# Patient Record
Sex: Male | Born: 2000 | Race: White | Hispanic: No | Marital: Single | Smoking: Never smoker
Health system: Southern US, Community
[De-identification: ages and names within clinical notes are randomized; demographics above are authoritative.]

## PROBLEM LIST (undated history)

## (undated) DIAGNOSIS — T7840XA Allergy, unspecified, initial encounter: Secondary | ICD-10-CM

## (undated) HISTORY — DX: Allergy, unspecified, initial encounter: T78.40XA

## (undated) HISTORY — PX: OTHER SURGICAL HISTORY: SHX169

---
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
## 2020-11-23 IMAGING — US US THYROID
1 series · 14 of 25 positions shown · non-contrast
Comparison: None.

CLINICAL DATA: Nodule

EXAM:
THYROID ULTRASOUND
TECHNIQUE: Ultrasound examination of the thyroid gland and adjacent soft
tissues was performed.

[Series 1: us thyroid · 0.07mm/px · 14 of 38 slices shown]
[im 1/38]
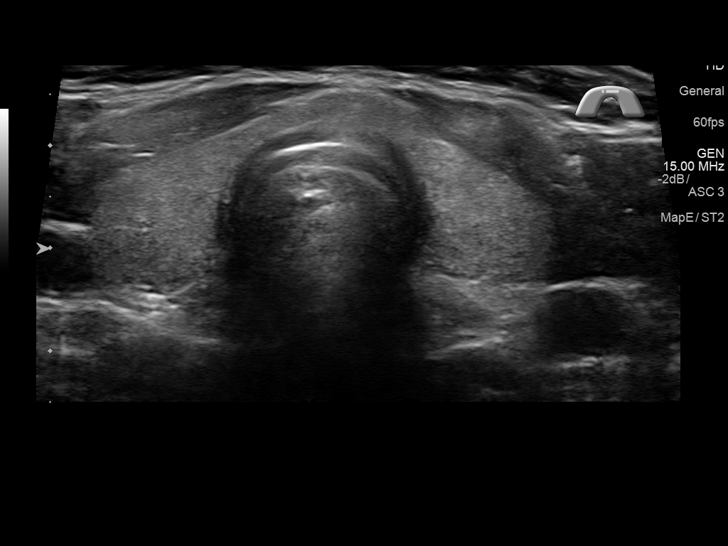
[im 4/38]
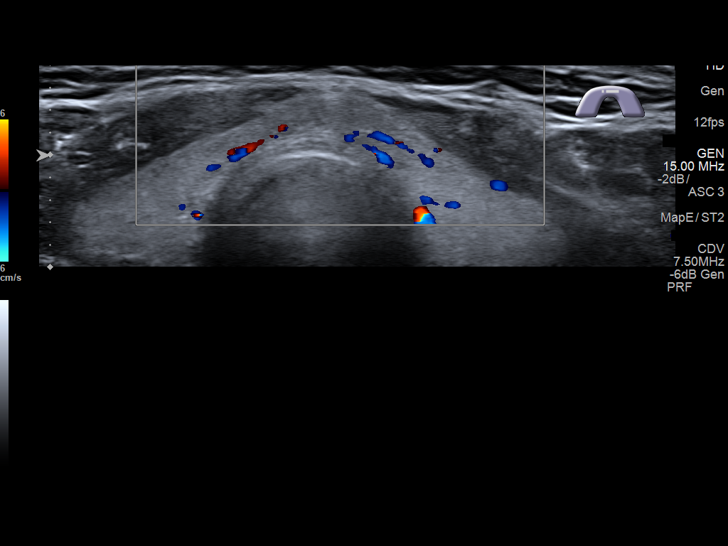
[im 7/38]
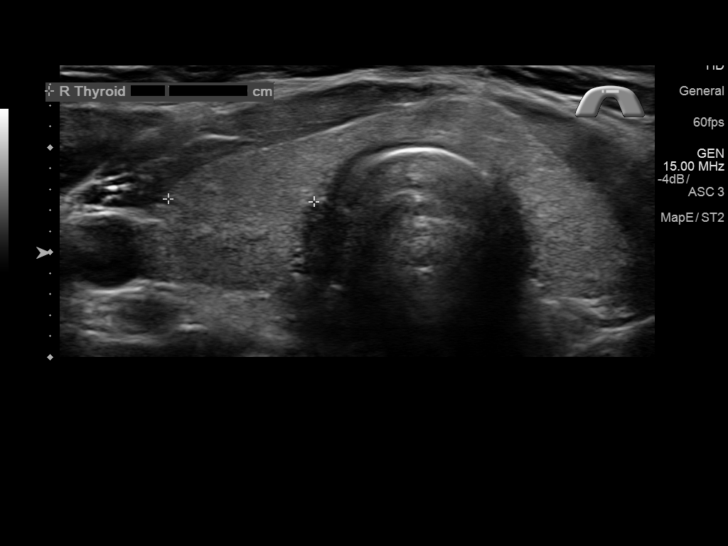
[im 10/38]
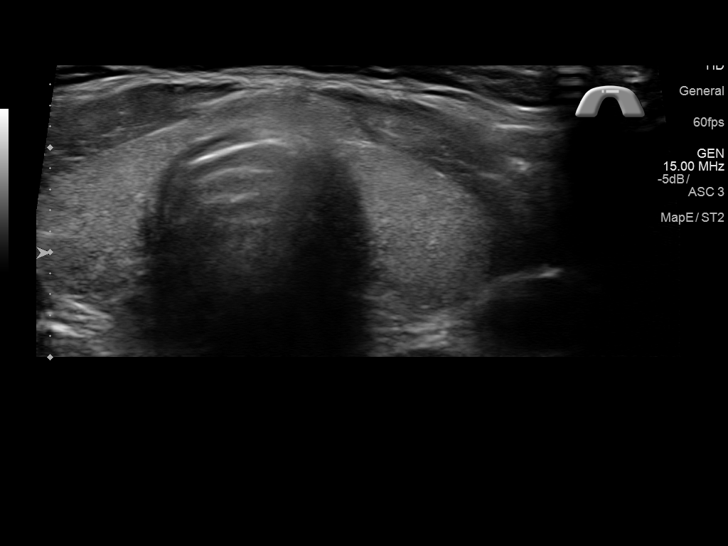
[im 13/38]
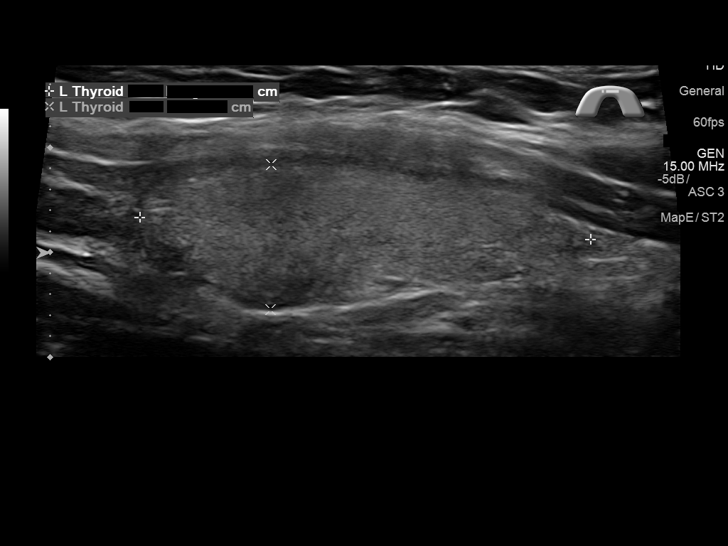
[im 14/38]
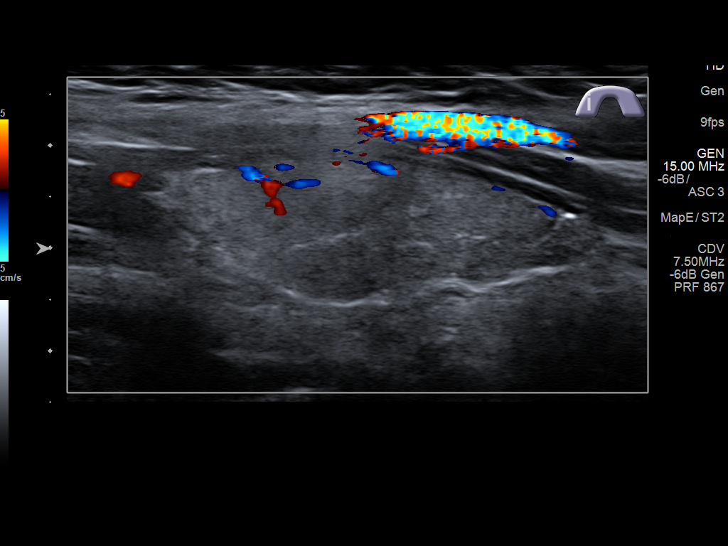
[im 17/38]
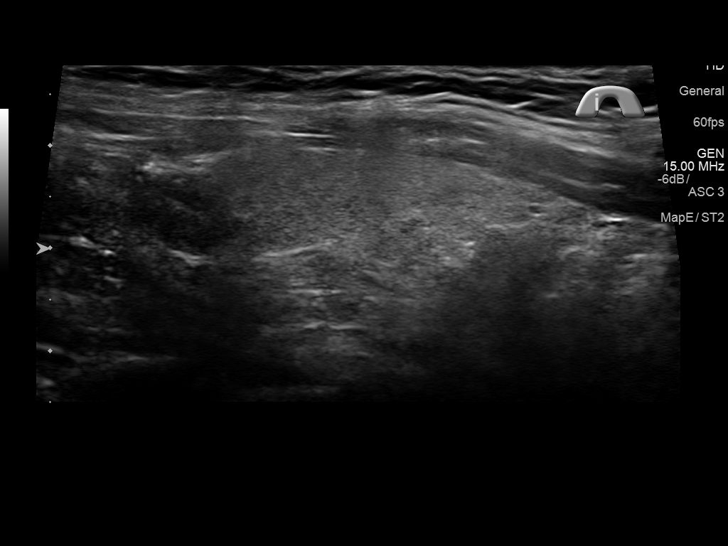
[im 21/38]
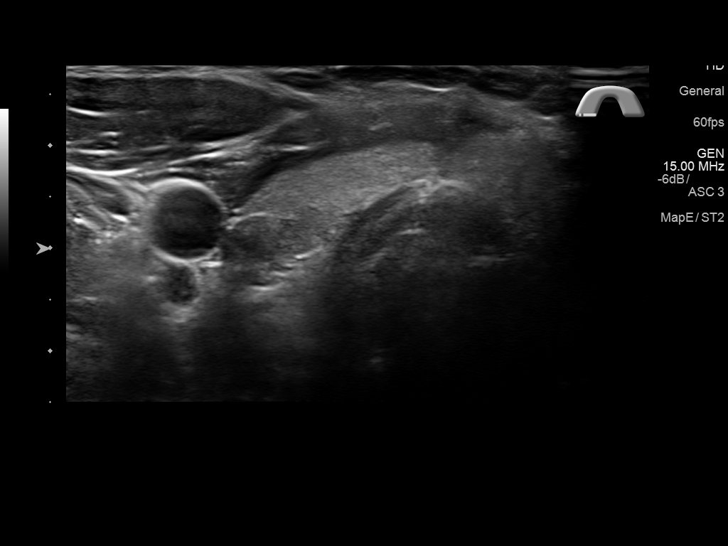
[im 24/38]
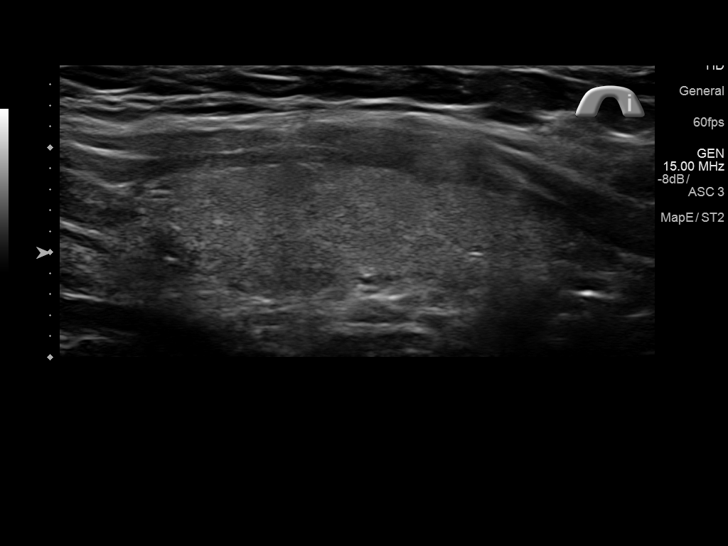
[im 25/38]
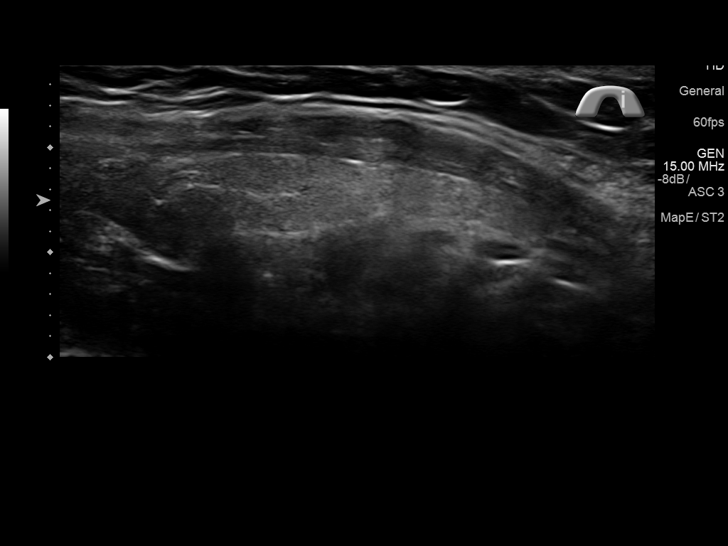
[im 28/38]
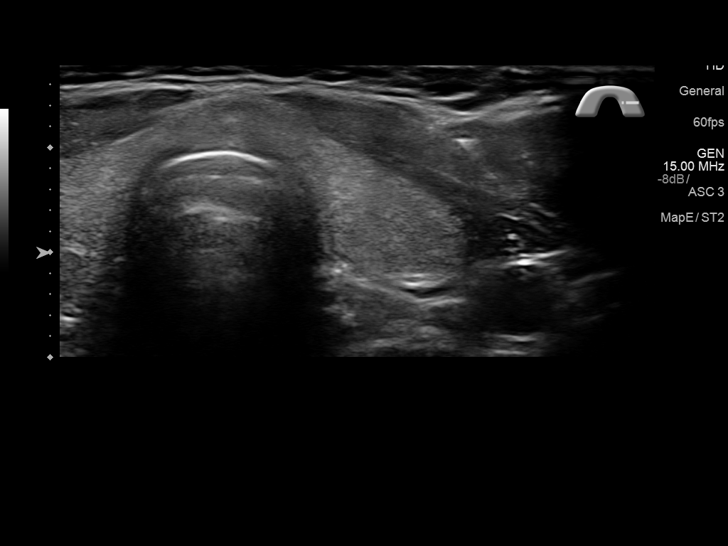
[im 31/38]
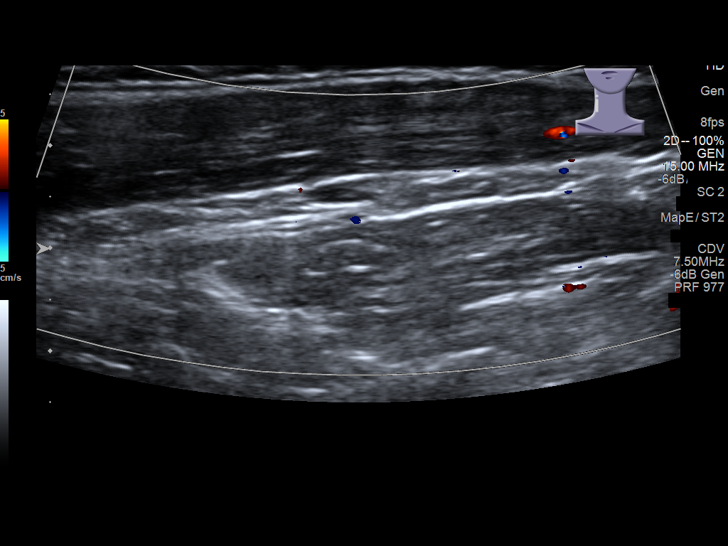
[im 34/38]
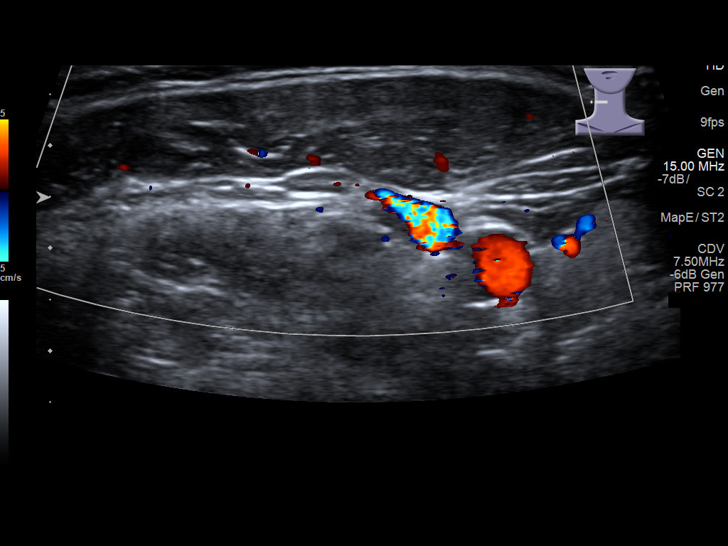
[im 38/38]
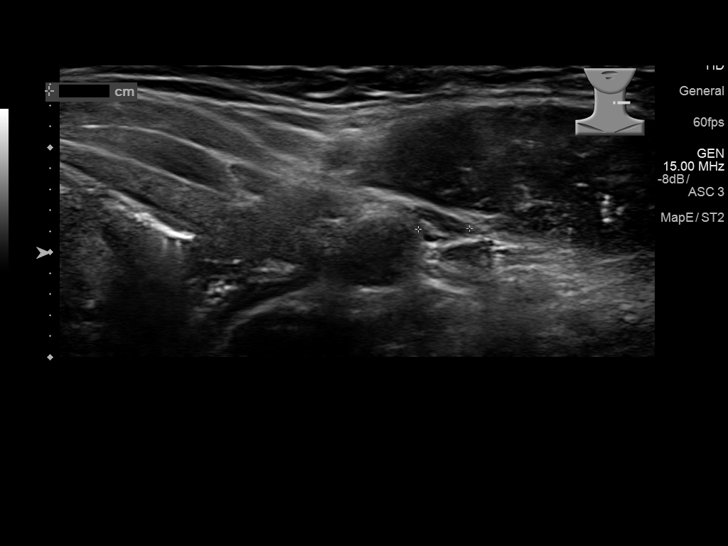

[14 of 25 positions shown; findings below may reference images not displayed]

FINDINGS: Parenchymal Echotexture: Normal

Isthmus: 0.4 cm thickness

Right lobe: 4.6 x 1.5 x 1.4 cm

Left lobe: 4.3 x 1.4 x 1.3 cm

_________________________________________________________

Estimated total number of nodules >/= 1 cm: 0

Number of spongiform nodules >/=  2 cm not described below (TR1): 0

Number of mixed cystic and solid nodules >/= 1.5 cm not described
below (TR2): 0

_________________________________________________________

No discrete nodules are seen within the thyroid gland.
IMPRESSION: Normal

The above is in keeping with the ACR TI-RADS recommendations - [HOSPITAL] 3869;[DATE].

## 2021-10-30 DIAGNOSIS — S62665A Nondisplaced fracture of distal phalanx of left ring finger, initial encounter for closed fracture: Secondary | ICD-10-CM | POA: Insufficient documentation

## 2021-10-30 DIAGNOSIS — M79642 Pain in left hand: Secondary | ICD-10-CM | POA: Insufficient documentation

## 2022-03-30 ENCOUNTER — Ambulatory Visit: Payer: Managed Care, Other (non HMO) | Attending: Internal Medicine

## 2022-03-30 ENCOUNTER — Other Ambulatory Visit: Payer: Self-pay

## 2022-03-30 DIAGNOSIS — Z23 Encounter for immunization: Secondary | ICD-10-CM

## 2022-03-30 MED ORDER — NOVAVAX COVID-19 VACCINE 5 MCG/0.5ML IM SUSP
INTRAMUSCULAR | 0 refills | Status: DC
  Filled 2022-03-30: qty 0.5, 1d supply, fill #0

## 2022-03-30 NOTE — Progress Notes (Signed)
? ?  Covid-19 Vaccination Clinic ? ?Name:  Darrell Greer    ?MRN: [id number] ?DOB: Feb 23, 2001 ? ?03/30/2022 ? ?[name] was observed post Covid-19 immunization for 15 minutes without incident. He was provided with Vaccine Information Sheet and instruction to access the V-Safe system.  ? ?[name] was instructed to call 911 with any severe reactions post vaccine: ?Difficulty breathing  ?Swelling of face and throat  ?A fast heartbeat  ?A bad rash all over body  ?Dizziness and weakness  ? ?Immunizations Administered   ? ? Name Date Dose VIS Date Route  ? Novavax(covid-19) Vaccine 03/30/2022 12:36 PM 0.5 mL 07/19/2021 Intramuscular  ? Manufacturer: [employer]  ? Lot: [id number]  ? NDC: 91638-466-59  ? ?  ? ?

## 2022-04-02 ENCOUNTER — Other Ambulatory Visit: Payer: Self-pay

## 2022-04-02 ENCOUNTER — Emergency Department: Payer: Managed Care, Other (non HMO)

## 2022-04-02 ENCOUNTER — Encounter: Payer: Self-pay | Admitting: Emergency Medicine

## 2022-04-02 ENCOUNTER — Inpatient Hospital Stay
Admission: EM | Admit: 2022-04-02 | Discharge: 2022-04-05 | DRG: 343 | Disposition: A | Discharge status: Home / Self Care | Payer: Managed Care, Other (non HMO) | Attending: Surgery | Admitting: Surgery

## 2022-04-02 DIAGNOSIS — K353 Acute appendicitis with localized peritonitis, without perforation or gangrene: Secondary | ICD-10-CM

## 2022-04-02 DIAGNOSIS — Z20822 Contact with and (suspected) exposure to covid-19: Secondary | ICD-10-CM | POA: Diagnosis present

## 2022-04-02 DIAGNOSIS — K36 Other appendicitis: Secondary | ICD-10-CM | POA: Diagnosis present

## 2022-04-02 DIAGNOSIS — Z8052 Family history of malignant neoplasm of bladder: Secondary | ICD-10-CM

## 2022-04-02 DIAGNOSIS — Z8249 Family history of ischemic heart disease and other diseases of the circulatory system: Secondary | ICD-10-CM

## 2022-04-02 DIAGNOSIS — Z818 Family history of other mental and behavioral disorders: Secondary | ICD-10-CM

## 2022-04-02 DIAGNOSIS — K3580 Unspecified acute appendicitis: Secondary | ICD-10-CM | POA: Diagnosis present

## 2022-04-02 DIAGNOSIS — Z888 Allergy status to other drugs, medicaments and biological substances status: Secondary | ICD-10-CM

## 2022-04-02 DIAGNOSIS — K352 Acute appendicitis with generalized peritonitis, without abscess: Secondary | ICD-10-CM | POA: Diagnosis not present

## 2022-04-02 DIAGNOSIS — Z811 Family history of alcohol abuse and dependence: Secondary | ICD-10-CM

## 2022-04-02 DIAGNOSIS — Z5331 Laparoscopic surgical procedure converted to open procedure: Secondary | ICD-10-CM

## 2022-04-02 DIAGNOSIS — Z808 Family history of malignant neoplasm of other organs or systems: Secondary | ICD-10-CM

## 2022-04-02 DIAGNOSIS — Z803 Family history of malignant neoplasm of breast: Secondary | ICD-10-CM

## 2022-04-02 LAB — RESP PANEL BY RT-PCR (FLU A&B, COVID) ARPGX2
Influenza A by PCR: NEGATIVE
Influenza B by PCR: NEGATIVE
SARS Coronavirus 2 by RT PCR: NEGATIVE

## 2022-04-02 LAB — CBC
HCT: 43.8 % (ref 39.0–52.0)
Hemoglobin: 14.9 g/dL (ref 13.0–17.0)
MCH: 27.8 pg (ref 26.0–34.0)
MCHC: 34 g/dL (ref 30.0–36.0)
MCV: 81.7 fL (ref 80.0–100.0)
Platelets: 349 10*3/uL (ref 150–400)
RBC: 5.36 MIL/uL (ref 4.22–5.81)
RDW: 12.2 % (ref 11.5–15.5)
WBC: 13 10*3/uL — ABNORMAL HIGH (ref 4.0–10.5)
nRBC: 0 % (ref 0.0–0.2)

## 2022-04-02 LAB — URINALYSIS, ROUTINE W REFLEX MICROSCOPIC
Bilirubin Urine: NEGATIVE
Glucose, UA: NEGATIVE mg/dL
Hgb urine dipstick: NEGATIVE
Ketones, ur: NEGATIVE mg/dL
Leukocytes,Ua: NEGATIVE
Nitrite: NEGATIVE
Protein, ur: NEGATIVE mg/dL
Specific Gravity, Urine: 1.021 (ref 1.005–1.030)
pH: 5 (ref 5.0–8.0)

## 2022-04-02 LAB — COMPREHENSIVE METABOLIC PANEL
ALT: 32 U/L (ref 0–44)
AST: 21 U/L (ref 15–41)
Albumin: 3.9 g/dL (ref 3.5–5.0)
Alkaline Phosphatase: 75 U/L (ref 38–126)
Anion gap: 8 (ref 5–15)
BUN: 12 mg/dL (ref 6–20)
CO2: 25 mmol/L (ref 22–32)
Calcium: 9.4 mg/dL (ref 8.9–10.3)
Chloride: 103 mmol/L (ref 98–111)
Creatinine, Ser: 0.87 mg/dL (ref 0.61–1.24)
GFR, Estimated: >60 mL/min (ref >60)
Glucose, Bld: 98 mg/dL (ref 70–99)
Potassium: 4 mmol/L (ref 3.5–5.1)
Sodium: 136 mmol/L (ref 135–145)
Total Bilirubin: 1 mg/dL (ref 0.3–1.2)
Total Protein: 7.6 g/dL (ref 6.5–8.1)

## 2022-04-02 LAB — LIPASE, BLOOD: Lipase: 24 U/L (ref 11–51)

## 2022-04-02 MED ORDER — HYDROMORPHONE HCL 1 MG/ML IJ SOLN
1.0000 mg | Freq: Once | INTRAMUSCULAR | Status: AC
  Administered 2022-04-02: 1 mg via INTRAVENOUS
  Filled 2022-04-02: qty 1

## 2022-04-02 MED ORDER — LACTATED RINGERS IV BOLUS
1000.0000 mL | Freq: Once | INTRAVENOUS | Status: AC
  Administered 2022-04-02: 1000 mL via INTRAVENOUS

## 2022-04-02 MED ORDER — ONDANSETRON HCL 4 MG/2ML IJ SOLN
4.0000 mg | Freq: Once | INTRAMUSCULAR | Status: AC
  Administered 2022-04-02: 4 mg via INTRAVENOUS
  Filled 2022-04-02: qty 2

## 2022-04-02 MED ORDER — HYDROMORPHONE HCL 1 MG/ML IJ SOLN
0.5000 mg | INTRAMUSCULAR | Status: DC | PRN
  Administered 2022-04-02 – 2022-04-03 (×4): 1 mg via INTRAVENOUS
  Administered 2022-04-03: 0.5 mg via INTRAVENOUS
  Administered 2022-04-03 – 2022-04-04 (×7): 1 mg via INTRAVENOUS
  Filled 2022-04-02 (×15): qty 1

## 2022-04-02 MED ORDER — ONDANSETRON 4 MG PO TBDP
4.0000 mg | ORAL_TABLET | Freq: Four times a day (QID) | ORAL | Status: DC | PRN
  Administered 2022-04-05: 4 mg via ORAL
  Filled 2022-04-02: qty 1

## 2022-04-02 MED ORDER — HYDROMORPHONE HCL 1 MG/ML IJ SOLN
1.0000 mg | Freq: Once | INTRAMUSCULAR | Status: AC
Start: 2022-04-02 — End: 2022-04-02
  Administered 2022-04-02: 1 mg via INTRAVENOUS
  Filled 2022-04-02: qty 1

## 2022-04-02 MED ORDER — ACETAMINOPHEN 500 MG PO TABS
1000.0000 mg | ORAL_TABLET | Freq: Four times a day (QID) | ORAL | Status: DC
  Administered 2022-04-03 – 2022-04-05 (×8): 1000 mg via ORAL
  Filled 2022-04-02 (×9): qty 2

## 2022-04-02 MED ORDER — IOHEXOL 300 MG/ML  SOLN
100.0000 mL | Freq: Once | INTRAMUSCULAR | Status: AC | PRN
  Administered 2022-04-02: 100 mL via INTRAVENOUS

## 2022-04-02 MED ORDER — SODIUM CHLORIDE 0.9 % IV SOLN: INTRAVENOUS | Status: DC

## 2022-04-02 MED ORDER — PIPERACILLIN-TAZOBACTAM 3.375 G IVPB
3.3750 g | Freq: Three times a day (TID) | INTRAVENOUS | Status: DC
  Administered 2022-04-02 – 2022-04-05 (×8): 3.375 g via INTRAVENOUS
  Filled 2022-04-02 (×8): qty 50

## 2022-04-02 MED ORDER — OXYCODONE HCL 5 MG PO TABS
5.0000 mg | ORAL_TABLET | ORAL | Status: DC | PRN
  Administered 2022-04-04: 10 mg via ORAL
  Administered 2022-04-05: 5 mg via ORAL
  Administered 2022-04-05: 10 mg via ORAL
  Filled 2022-04-02 (×3): qty 2

## 2022-04-02 MED ORDER — ONDANSETRON HCL 4 MG/2ML IJ SOLN
4.0000 mg | Freq: Four times a day (QID) | INTRAMUSCULAR | Status: DC | PRN
  Administered 2022-04-03 – 2022-04-04 (×2): 4 mg via INTRAVENOUS
  Filled 2022-04-02 (×2): qty 2

## 2022-04-02 NOTE — Progress Notes (Signed)
Pharmacy Antibiotic Note ? ?Darrell Greer is a 21 y.o. male admitted on 04/02/2022 with acute appendicitis.  Pharmacy has been consulted for Zosyn dosing. ? ?Plan: ?Zosyn 3.375g IV q8h (4 hour infusion). ? ?Height: 5\' 10"  (177.8 cm) ?Weight: 109.8 kg (242 lb) ?IBW/kg (Calculated) : 73 ? ?Temp (24hrs), Avg:99.1 ?F (37.3 ?C), Min:99.1 ?F (37.3 ?C), Max:99.1 ?F (37.3 ?C) ? ?Recent Labs  ?Lab 04/02/22 ?1513  ?WBC 13.0*  ?CREATININE 0.87  ?  ?Estimated Creatinine Clearance: 168 mL/min (by C-G formula based on SCr of 0.87 mg/dL).   ? ?Allergies  ?Allergen Reactions  ? Cefdinir Diarrhea, Nausea And Vomiting and Other (See Comments)  ?  Other reaction(s): GI Intolerance, GI Upset (intolerance)  ? ? ?Antimicrobials this admission: ?4/24 Zosyn >>  ? ?Dose adjustments this admission: ? ? ?Microbiology results: ? BCx:  ? UCx:   ? Sputum:   ? MRSA PCR:  ? ?Thank you for allowing pharmacy to be a part of this patientDarrells care. ? ?5/24 Jere Bostrom ?04/02/2022 5:49 PM ? ?

## 2022-04-02 NOTE — ED Triage Notes (Signed)
Pt via POV from home. Pt c/o RLQ pain that started 2 weeks ago but got worse 2 days ago. Denies any abd surgeries. Pt also endorses some nausea. Pt is A&OX4 and NAD.  ?

## 2022-04-02 NOTE — H&P (Signed)
[facility] ?SURGICAL HISTORY & PHYSICAL (cpt [phone]) ? ?HISTORY OF PRESENT ILLNESS (HPI):  ?21 y.o. male presented to [facility] ED today for abdominal pain. Patient reports RLQ abdominal pain for the last 2 weeks. He thought initially he pulled a muscle but the pain has persisted over this time. No fever, chills, CP, nausea, emesis, bowel change, or urinary change. No history of similar. No previous intra-abdominal surgeries. Work up n the ED revealed a leukocytosis to 13.0K but laboratory work up was otherwise reassuring. CT Abdomen/Pelvis was concerning for acute uncomplicated appendicitis.  ? ?General surgery is consulted by emergency medicine physician Dr Shaune Pollackameron Isaacs, MD for evaluation and management pf acute appendicitis.  ? ? ?PAST MEDICAL HISTORY (PMH):  ?Past Medical History:  ?Diagnosis Date  ? Allergy   ?  ?Reviewed. Otherwise negative.  ? ?PAST SURGICAL HISTORY (PSH):  ?Past Surgical History:  ?Procedure Laterality Date  ? NOSE SURGERY  2015  ?  ?Reviewed. Otherwise negative.  ? ?MEDICATIONS:  ?Prior to Admission medications   ?Medication Sig Start Date End Date Taking? Authorizing Provider  ?COVID-19 adjuvanted vaccine, NOVAVAX, (NOVAVAX COVID-19 VACCINE) 5 MCG/0.5ML injection Inject into the muscle. 03/30/22   Judyann MunsonSnider, Cynthia, MD  ?ibuprofen (ADVIL,MOTRIN) 200 MG tablet Take 200 mg by mouth as needed.    [provider]  ?loratadine (CLARITIN) 10 MG tablet Take 10 mg by mouth daily.    [provider]  ?pseudoephedrine (SUDAFED) 120 MG 12 hr tablet Take 120 mg by mouth 2 (two) times daily as needed for congestion.    [provider]  ?  ? ?ALLERGIES:  ?Allergies  ?Allergen Reactions  ? Cefdinir Diarrhea, Nausea And Vomiting and Other (See Comments)  ?  Other reaction(s): GI Intolerance, GI Upset (intolerance)  ?  ? ?SOCIAL HISTORY:  ?Social History  ? ?Socioeconomic History  ? Marital status: Single  ?  Spouse name: Not on file  ? Number of children: Not on file   ? Years of education: Not on file  ? Highest education level: 11th grade  ?Occupational History  ? Occupation: HS student  ?Tobacco Use  ? Smoking status: Never  ? Smokeless tobacco: Never  ?Vaping Use  ? Vaping Use: Never used  ?Substance and Sexual Activity  ? Alcohol use: Never  ? Drug use: Never  ? Sexual activity: Not Currently  ?  Birth control/protection: None  ?Other Topics Concern  ? Not on file  ?Social History Narrative  ? Patient will be January 2019 HS graduate.  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  ? ?Social Determinants of Health  ? ?Financial Resource Strain: Not on file  ?Food Insecurity: Not on file  ?Transportation Needs: Not on file  ?Physical Activity: Not on file  ?Stress: Not on file  ?Social Connections: Not on file  ?Intimate Partner Violence: Not on file  ?  ? ?FAMILY HISTORY:  ?Family History  ?Problem Relation Age of Onset  ? Alcohol abuse Brother   ? Bipolar disorder Brother   ? Throat cancer Maternal Grandfather   ? Breast cancer Paternal Grandmother   ? Healthy Sister   ? Bladder Cancer Paternal Grandfather   ? Heart disease Paternal Grandfather   ?     Pacemaker  ? Depression Brother   ?  ?Otherwise negative.  ? ?REVIEW OF SYSTEMS:  ?Review of Systems  ?Constitutional:  Negative for chills and fever.  ?HENT:  Negative for congestion and sore  throat.   ?Respiratory:  Negative for cough and shortness of breath.   ?Cardiovascular:  Negative for chest pain and palpitations.  ?Gastrointestinal:  Positive for abdominal pain. Negative for diarrhea, nausea and vomiting.  ?Genitourinary:  Negative for dysuria and urgency.  ?All other systems reviewed and are negative. ? ?VITAL SIGNS:  ?Temp:  [99.1 ?F (37.3 ?C)] 99.1 ?F (37.3 ?C) (04/24 1509) ?Pulse Rate:  [96-105] 96 (04/24 1923) ?Resp:  [15-16] 15 (04/24 1923) ?BP: (117-142)/(75-83) 117/76 (04/24 1923) ?SpO2:  [99 %-100 %] 100 % (04/24 1923) ?Weight:  [109.8 kg] 109.8  kg (04/24 1511)     Height: 5\' 10"  (177.8 cm) Weight: 109.8 kg BMI (Calculated): 34.72  ? ?PHYSICAL EXAM:  ?Physical Exam ?Vitals and nursing note reviewed. Exam conducted with a chaperone present.  ?Constitutional:   ?   General: He is not in acute distress. ?   Appearance: He is well-developed. He is obese. He is not ill-appearing.  ?   Comments: Patient resting comfortably, NAD, family at bedside   ?Eyes:  ?   General: No scleral icterus. ?   Extraocular Movements: Extraocular movements intact.  ?Cardiovascular:  ?   Rate and Rhythm: Normal rate and regular rhythm.  ?   Heart sounds: Normal heart sounds. No murmur heard. ?Pulmonary:  ?   Effort: Pulmonary effort is normal. No respiratory distress.  ?   Breath sounds: Normal breath sounds.  ?Abdominal:  ?   General: There is no distension.  ?   Palpations: Abdomen is soft.  ?   Tenderness: There is abdominal tenderness in the right lower quadrant. There is no guarding or rebound.  ?Genitourinary: ?   Comments: Deferred ?Skin: ?   General: Skin is warm and dry.  ?   Coloration: Skin is not jaundiced or pale.  ?Neurological:  ?   General: No focal deficit present.  ?   Mental Status: He is alert and oriented to person, place, and time.  ? ? ?INTAKE/OUTPUT:  ?This shift: Total I/O ?In: 1000 [IV Piggyback:1000] ?Out: -   ?Last 2 shifts: @IOLAST2SHIFTS @ ? ?Labs:  ? ?  Latest Ref Rng & Units 04/02/2022  ?  3:13 PM  ?CBC  ?WBC 4.0 - 10.5 K/uL 13.0    ?Hemoglobin 13.0 - 17.0 g/dL    ?Hematocrit 39.0 - 52.0 % 43.8    ?Platelets 150 - 400 K/uL 349    ? ? ?  Latest Ref Rng & Units 04/02/2022  ?  3:13 PM  ?CMP  ?Glucose 70 - 99 mg/dL 98    ?BUN 6 - 20 mg/dL 12    ?Creatinine 0.61 - 1.24 mg/dL 41.7    ?Sodium 135 - 145 mmol/L 136    ?Potassium 3.5 - 5.1 mmol/L 4.0    ?Chloride 98 - 111 mmol/L 103    ?CO2 22 - 32 mmol/L 25    ?Calcium 8.9 - 10.3 mg/dL 9.4    ?Total Protein 6.5 - 8.1 g/dL 7.6    ?Total Bilirubin 0.3 - 1.2 mg/dL 1.0    ?Alkaline Phos 38 - 126 U/L 75    ?AST 15  - 41 U/L 21    ?ALT 0 - 44 U/L 32    ? ? ?Imaging studies:  ? ?CT Abdomen/Pelvis (04/02/2022) personally reviewed which shows peri-appendiceal inflammation,no abscess, no free air, and radiologist report reviewed below:  ?IMPRESSION: ?Acute appendicitis without evidence of perforation or abscess ?formation. Small appendicolith at the base of the appendix.  ? ? ?Assessment/Plan: (  ICD-10's: K35.30) ?21 y.o. male with leukocytosis and RLQ abdominal pain concerning for acute appendicitis.  ? ? - Admit to general surgery  ?- Will plan on laparoscopic appendectomy with Dr Claudine Mouton pending OR/Anesthesia availability   ? - All risks, benefits, and alternatives to above procedure(s) were discussed with the patient, all of his questions were answered to his expressed satisfaction, patient expresses he wishes to proceed, and informed consent was obtained.   ? - NPO + IVF Resuscitation  ?- IV Abx (Zosyn) ?- Monitor abdominal examination; on-going bowel function ?- Pain control prn; antiemetics prn  ? - DVT prophylaxis; Hold for OR ? ?All of the above findings and recommendations were discussed with the patient, and all of his and his family's questions were answered to their expressed satisfaction. ? ?-- ?Lynden Oxford, PA-C ?[facility] ?04/02/2022, 7:44 PM ?M-F: 7am - 4pm ? ?

## 2022-04-02 NOTE — ED Notes (Signed)
Report received from Morgan, RN 

## 2022-04-02 NOTE — ED Provider Notes (Signed)
? ?[facility] ?Provider Note ? ? ? Event Date/Time  ? First MD Initiated Contact with Patient 04/02/22 1619   ?  (approximate) ? ? ?History  ? ?Abdominal Pain ? ? ?HPI ? ?Darrell Greer is a 21 y.o. male with no significant past medical history is here with right lower quadrant abdominal pain.  The patient states that approximately 2 weeks ago, his pain began as a initially mild pain in his right side of his abdomen, particular right lower quadrant.  Since that, the pain has been fairly constant.  However, the last 2 days or so, it has significantly worsened and began rating to his back.  The pain is now severe, worse with movement.  It is worse with lifting his legs up.  He said associated nausea and vomiting and a near syncopal episode today when he was having a bowel movement.  He feels very weak.  Has had decreased appetite.  No known fevers.  No history of similar issues in the past. ?  ? ? ?Physical Exam  ? ?Triage Vital Signs: ?ED Triage Vitals  ?Enc Vitals Group  ?   BP 04/02/22 1509 130/83  ?   Pulse Rate 04/02/22 1509 (!) 105  ?   Resp 04/02/22 1509 16  ?   Temp 04/02/22 1509 99.1 ?F (37.3 ?C)  ?   Temp Source 04/02/22 1509 Oral  ?   SpO2 04/02/22 1509 99 %  ?   Weight 04/02/22 1511 242 lb (109.8 kg)  ?   Height 04/02/22 1511 5\' 10"  (1.778 m)  ?   Head Circumference --   ?   Peak Flow --   ?   Pain Score 04/02/22 1511 7  ?   Pain Loc --   ?   Pain Edu? --   ?   Excl. in [place]? --   ? ? ?Most recent vital signs: ?Vitals:  ? 04/02/22 1624 04/02/22 1716  ?BP: 139/82 (!) 142/75  ?Pulse: 98 96  ?Resp: 16 16  ?Temp:    ?SpO2: 99% 100%  ? ? ? ?General: Awake, no distress.  ?CV:  Good peripheral perfusion.  ?Resp:  Normal effort.  ?Abd:  No distention.  Significant right lower quadrant abdominal tenderness with rebound.  Positive Rovsing's.  Significant pain with right heeltap.   ?Other:  Moist mucous membranes.  Nontoxic. ? ? ?ED Results / Procedures / Treatments  ? ?Labs ?(all labs ordered  are listed, but only abnormal results are displayed) ?Labs Reviewed  ?CBC - Abnormal; Notable for the following components:  ?    Result Value  ? WBC 13.0 (*)   ? All other components within normal limits  ?URINALYSIS, ROUTINE W REFLEX MICROSCOPIC - Abnormal; Notable for the following components:  ? Color, Urine YELLOW (*)   ? APPearance CLEAR (*)   ? Bacteria, UA RARE (*)   ? All other components within normal limits  ?RESP PANEL BY RT-PCR (FLU A&B, COVID) [id number]  ?LIPASE, BLOOD  ?COMPREHENSIVE METABOLIC PANEL  ? ? ? ?EKG ? ? ? ?RADIOLOGY ?CT abdomen/pelvis: Acute appendicitis with significant surrounding inflammation on my review ? ? ?I also independently reviewed and agree with radiologist interpretations. ? ? ?PROCEDURES: ? ?Critical Care performed: No ? ? ? ? ?MEDICATIONS ORDERED IN ED: ?Medications  ?lactated ringers bolus 1,000 mL (1,000 mLs Intravenous New Bag/Given 04/02/22 1718)  ?HYDROmorphone (DILAUDID) injection 1 mg (1 mg Intravenous Given 04/02/22 1719)  ?ondansetron (ZOFRAN) injection 4 mg (4 mg  Intravenous Given 04/02/22 1718)  ?iohexol (OMNIPAQUE) 300 MG/ML solution 100 mL (100 mLs Intravenous Contrast Given 04/02/22 1726)  ? ? ? ?IMPRESSION / MDM / ASSESSMENT AND PLAN / ED COURSE  ?I reviewed the triage vital signs and the nursing notes. ?             ?               ? ? ?Ddx:  ?Acute appendicitis, cholecystitis, colitis, diverticulitis, obstruction, musculoskeletal pain, inguinal hernia ? ? ?MDM:  ?Pleasant 21 year old male here with right lower quadrant abdominal pain.  Patient is hemodynamically stable and afebrile.  Lab work shows moderate leukocytosis, pyuria, and normal LFTs and renal function on CMP.  Lipase is normal.  Exam concerning for appendicitis, so CT abdomen and pelvis was obtained, which does show acute appendicitis.  He has significant surrounding phlegmon.  He has diffuse peritonitis.  No evidence of perforation on my preliminary read.  Patient given empiric IV Zosyn, surgery  consulted, and will plan to admit.  Patient is NPO.  He is hemodynamically stable. ? ? ?MEDICATIONS GIVEN IN ED: ?Medications  ?lactated ringers bolus 1,000 mL (1,000 mLs Intravenous New Bag/Given 04/02/22 1718)  ?HYDROmorphone (DILAUDID) injection 1 mg (1 mg Intravenous Given 04/02/22 1719)  ?ondansetron [facility]) injection 4 mg (4 mg Intravenous Given 04/02/22 1718)  ?iohexol (OMNIPAQUE) 300 MG/ML solution 100 mL (100 mLs Intravenous Contrast Given 04/02/22 1726)  ? ? ? ?Consults:  ?General Surgery Dr. Christian Mate ? ? ?EMR reviewed  ?No relevant ? ? ? ? ?FINAL CLINICAL IMPRESSION(S) / ED DIAGNOSES  ? ?Final diagnoses:  ?Acute appendicitis with generalized peritonitis without gangrene, unspecified whether abscess present, unspecified whether perforation present  ? ? ? ?Rx / DC Orders  ? ?ED Discharge Orders   ? ? None  ? ?  ? ? ? ?Note:  This document was prepared using Dragon voice recognition software and may include unintentional dictation errors. ?  ?Duffy Bruce, MD ?04/02/22 1822 ? ?

## 2022-04-03 ENCOUNTER — Other Ambulatory Visit: Payer: Self-pay

## 2022-04-03 ENCOUNTER — Encounter
Admission: EM | Disposition: A | Discharge status: Home / Self Care | Payer: Self-pay | Source: Home / Self Care | Attending: Surgery

## 2022-04-03 ENCOUNTER — Observation Stay: Payer: Managed Care, Other (non HMO) | Admitting: Anesthesiology

## 2022-04-03 DIAGNOSIS — Z888 Allergy status to other drugs, medicaments and biological substances status: Secondary | ICD-10-CM | POA: Diagnosis not present

## 2022-04-03 DIAGNOSIS — Z808 Family history of malignant neoplasm of other organs or systems: Secondary | ICD-10-CM | POA: Diagnosis not present

## 2022-04-03 DIAGNOSIS — K352 Acute appendicitis with generalized peritonitis, without abscess: Secondary | ICD-10-CM | POA: Diagnosis present

## 2022-04-03 DIAGNOSIS — Z818 Family history of other mental and behavioral disorders: Secondary | ICD-10-CM | POA: Diagnosis not present

## 2022-04-03 DIAGNOSIS — Z5331 Laparoscopic surgical procedure converted to open procedure: Secondary | ICD-10-CM | POA: Diagnosis not present

## 2022-04-03 DIAGNOSIS — Z8052 Family history of malignant neoplasm of bladder: Secondary | ICD-10-CM | POA: Diagnosis not present

## 2022-04-03 DIAGNOSIS — K3580 Unspecified acute appendicitis: Secondary | ICD-10-CM | POA: Diagnosis present

## 2022-04-03 DIAGNOSIS — Z8249 Family history of ischemic heart disease and other diseases of the circulatory system: Secondary | ICD-10-CM | POA: Diagnosis not present

## 2022-04-03 DIAGNOSIS — Z20822 Contact with and (suspected) exposure to covid-19: Secondary | ICD-10-CM | POA: Diagnosis present

## 2022-04-03 DIAGNOSIS — K36 Other appendicitis: Secondary | ICD-10-CM | POA: Diagnosis present

## 2022-04-03 DIAGNOSIS — Z811 Family history of alcohol abuse and dependence: Secondary | ICD-10-CM | POA: Diagnosis not present

## 2022-04-03 DIAGNOSIS — Z803 Family history of malignant neoplasm of breast: Secondary | ICD-10-CM | POA: Diagnosis not present

## 2022-04-03 HISTORY — PX: LAPAROSCOPIC APPENDECTOMY: SHX408

## 2022-04-03 SURGERY — APPENDECTOMY, LAPAROSCOPIC
Anesthesia: General | Site: Abdomen

## 2022-04-03 MED ORDER — PROPOFOL 10 MG/ML IV BOLUS
INTRAVENOUS | Status: AC
  Filled 2022-04-03: qty 20

## 2022-04-03 MED ORDER — 0.9 % SODIUM CHLORIDE (POUR BTL) OPTIME
TOPICAL | Status: DC | PRN
  Administered 2022-04-03: 50 mL
  Administered 2022-04-03: 800 mL

## 2022-04-03 MED ORDER — LACTATED RINGERS IV SOLN
INTRAVENOUS | Status: DC | PRN
Start: 2022-04-03 — End: 2022-04-03

## 2022-04-03 MED ORDER — OXYCODONE HCL 5 MG/5ML PO SOLN: 5.0000 mg | Freq: Once | ORAL | Status: AC | PRN

## 2022-04-03 MED ORDER — ACETAMINOPHEN 10 MG/ML IV SOLN
INTRAVENOUS | Status: AC
  Filled 2022-04-03: qty 100

## 2022-04-03 MED ORDER — KETOROLAC TROMETHAMINE 30 MG/ML IJ SOLN
30.0000 mg | Freq: Four times a day (QID) | INTRAMUSCULAR | Status: DC | PRN
  Administered 2022-04-03: 30 mg via INTRAVENOUS
  Filled 2022-04-03 (×2): qty 1

## 2022-04-03 MED ORDER — OXYCODONE HCL 5 MG PO TABS
5.0000 mg | ORAL_TABLET | Freq: Once | ORAL | Status: AC | PRN
  Administered 2022-04-03: 5 mg via ORAL

## 2022-04-03 MED ORDER — ONDANSETRON HCL 4 MG/2ML IJ SOLN: 4.0000 mg | Freq: Once | INTRAMUSCULAR | Status: DC | PRN

## 2022-04-03 MED ORDER — PROPOFOL 10 MG/ML IV BOLUS
INTRAVENOUS | Status: DC | PRN
  Administered 2022-04-03: 200 mg via INTRAVENOUS

## 2022-04-03 MED ORDER — FENTANYL CITRATE (PF) 100 MCG/2ML IJ SOLN
INTRAMUSCULAR | Status: AC
  Filled 2022-04-03: qty 2

## 2022-04-03 MED ORDER — METHOCARBAMOL 500 MG PO TABS
500.0000 mg | ORAL_TABLET | Freq: Three times a day (TID) | ORAL | Status: DC | PRN
  Administered 2022-04-04: 500 mg via ORAL
  Filled 2022-04-03: qty 1

## 2022-04-03 MED ORDER — MIDAZOLAM HCL 2 MG/2ML IJ SOLN
INTRAMUSCULAR | Status: DC | PRN
  Administered 2022-04-03: 2 mg via INTRAVENOUS

## 2022-04-03 MED ORDER — ONDANSETRON HCL 4 MG/2ML IJ SOLN
INTRAMUSCULAR | Status: DC | PRN
  Administered 2022-04-03: 4 mg via INTRAVENOUS

## 2022-04-03 MED ORDER — DEXMEDETOMIDINE (PRECEDEX) IN NS 20 MCG/5ML (4 MCG/ML) IV SYRINGE
PREFILLED_SYRINGE | INTRAVENOUS | Status: DC | PRN
  Administered 2022-04-03 (×3): 4 ug via INTRAVENOUS

## 2022-04-03 MED ORDER — DEXAMETHASONE SODIUM PHOSPHATE 10 MG/ML IJ SOLN
INTRAMUSCULAR | Status: DC | PRN
Start: 2022-04-03 — End: 2022-04-03
  Administered 2022-04-03: 10 mg via INTRAVENOUS

## 2022-04-03 MED ORDER — SUCCINYLCHOLINE CHLORIDE 200 MG/10ML IV SOSY
PREFILLED_SYRINGE | INTRAVENOUS | Status: DC | PRN
  Administered 2022-04-03: 120 mg via INTRAVENOUS

## 2022-04-03 MED ORDER — ACETAMINOPHEN 10 MG/ML IV SOLN
INTRAVENOUS | Status: DC | PRN
  Administered 2022-04-03: 1000 mg via INTRAVENOUS

## 2022-04-03 MED ORDER — ROCURONIUM BROMIDE 100 MG/10ML IV SOLN
INTRAVENOUS | Status: DC | PRN
Start: 2022-04-03 — End: 2022-04-03
  Administered 2022-04-03: 20 mg via INTRAVENOUS
  Administered 2022-04-03: 5 mg via INTRAVENOUS
  Administered 2022-04-03: 35 mg via INTRAVENOUS

## 2022-04-03 MED ORDER — SUGAMMADEX SODIUM 200 MG/2ML IV SOLN
INTRAVENOUS | Status: DC | PRN
  Administered 2022-04-03: 250 mg via INTRAVENOUS

## 2022-04-03 MED ORDER — BUPIVACAINE-EPINEPHRINE 0.25% -1:200000 IJ SOLN
INTRAMUSCULAR | Status: DC | PRN
  Administered 2022-04-03: 30 mL

## 2022-04-03 MED ORDER — OXYCODONE HCL 5 MG PO TABS
ORAL_TABLET | ORAL | Status: AC
  Filled 2022-04-03: qty 1

## 2022-04-03 MED ORDER — SODIUM CHLORIDE 0.9 % IR SOLN
Status: DC | PRN
  Administered 2022-04-03: 50 mL

## 2022-04-03 MED ORDER — FENTANYL CITRATE (PF) 100 MCG/2ML IJ SOLN
INTRAMUSCULAR | Status: DC | PRN
  Administered 2022-04-03 (×2): 50 ug via INTRAVENOUS

## 2022-04-03 MED ORDER — FENTANYL CITRATE (PF) 100 MCG/2ML IJ SOLN
25.0000 ug | INTRAMUSCULAR | Status: DC | PRN
  Administered 2022-04-03 (×4): 25 ug via INTRAVENOUS

## 2022-04-03 MED ORDER — SUGAMMADEX SODIUM 500 MG/5ML IV SOLN
INTRAVENOUS | Status: AC
  Filled 2022-04-03: qty 5

## 2022-04-03 MED ORDER — BUPIVACAINE-EPINEPHRINE (PF) 0.25% -1:200000 IJ SOLN
INTRAMUSCULAR | Status: AC
  Filled 2022-04-03: qty 30

## 2022-04-03 MED ORDER — PHENYLEPHRINE 80 MCG/ML (10ML) SYRINGE FOR IV PUSH (FOR BLOOD PRESSURE SUPPORT)
PREFILLED_SYRINGE | INTRAVENOUS | Status: DC | PRN
  Administered 2022-04-03: 80 ug via INTRAVENOUS

## 2022-04-03 MED ORDER — LACTATED RINGERS IV SOLN: INTRAVENOUS | Status: DC

## 2022-04-03 MED ORDER — DEXMEDETOMIDINE HCL IN NACL 80 MCG/20ML IV SOLN
INTRAVENOUS | Status: AC
  Filled 2022-04-03: qty 20

## 2022-04-03 MED ORDER — LIDOCAINE HCL (CARDIAC) PF 100 MG/5ML IV SOSY
PREFILLED_SYRINGE | INTRAVENOUS | Status: DC | PRN
  Administered 2022-04-03: 100 mg via INTRAVENOUS

## 2022-04-03 MED ORDER — ACETAMINOPHEN 10 MG/ML IV SOLN: 1000.0000 mg | Freq: Once | INTRAVENOUS | Status: DC | PRN

## 2022-04-03 MED ORDER — MIDAZOLAM HCL 2 MG/2ML IJ SOLN
INTRAMUSCULAR | Status: AC
Start: 2022-04-03 — End: ?
  Filled 2022-04-03: qty 2

## 2022-04-03 SURGICAL SUPPLY — 52 items
ADH SKN CLS APL DERMABOND .7 (GAUZE/BANDAGES/DRESSINGS) ×1
BAG RETRIEVAL 10 (BASKET) ×1
BASIN GRAD PLASTIC 32OZ STRL (MISCELLANEOUS) ×1 IMPLANT
BLADE CLIPPER SURG (BLADE) ×2 IMPLANT
CUTTER FLEX LINEAR 45M (STAPLE) ×2 IMPLANT
DERMABOND ADVANCED (GAUZE/BANDAGES/DRESSINGS) ×1
DERMABOND ADVANCED .7 DNX12 (GAUZE/BANDAGES/DRESSINGS) ×1 IMPLANT
DRSG OPSITE POSTOP 4X10 (GAUZE/BANDAGES/DRESSINGS) ×1 IMPLANT
ELECT CAUTERY BLADE 6.4 (BLADE) ×1 IMPLANT
ELECT REM PT RETURN 9FT ADLT (ELECTROSURGICAL) ×2
ELECTRODE REM PT RTRN 9FT ADLT (ELECTROSURGICAL) ×1 IMPLANT
GLOVE INDICATOR 7.5 STRL GRN (GLOVE) ×1 IMPLANT
GLOVE ORTHO TXT STRL SZ7.5 (GLOVE) ×2 IMPLANT
GLOVE PROTEXIS LATEX SZ 7.5 (GLOVE) ×8 IMPLANT
GLOVE SURG LATEX 7.5 PF (GLOVE) IMPLANT
GOWN STRL REUS W/ TWL LRG LVL3 (GOWN DISPOSABLE) ×1 IMPLANT
GOWN STRL REUS W/ TWL XL LVL3 (GOWN DISPOSABLE) ×1 IMPLANT
GOWN STRL REUS W/TWL LRG LVL3 (GOWN DISPOSABLE) ×2
GOWN STRL REUS W/TWL XL LVL3 (GOWN DISPOSABLE) ×2
GRASPER SUT TROCAR 14GX15 (MISCELLANEOUS) ×1 IMPLANT
HANDLE YANKAUER SUCT BULB TIP (MISCELLANEOUS) ×1 IMPLANT
IRRIGATION STRYKERFLOW (MISCELLANEOUS) IMPLANT
IRRIGATOR STRYKERFLOW (MISCELLANEOUS) ×2
IV NS IRRIG 3000ML ARTHROMATIC (IV SOLUTION) ×1 IMPLANT
KIT TURNOVER KIT A (KITS) ×2 IMPLANT
MANIFOLD NEPTUNE II (INSTRUMENTS) ×2 IMPLANT
NDL INSUFFLATION 14GA 120MM (NEEDLE) IMPLANT
NEEDLE HYPO 22GX1.5 SAFETY (NEEDLE) ×2 IMPLANT
NEEDLE INSUFFLATION 14GA 120MM (NEEDLE) ×2 IMPLANT
NS IRRIG 500ML POUR BTL (IV SOLUTION) ×2 IMPLANT
PACK LAP CHOLECYSTECTOMY (MISCELLANEOUS) ×2 IMPLANT
PENCIL ELECTRO HAND CTR (MISCELLANEOUS) ×1 IMPLANT
RELOAD 45 VASCULAR/THIN (ENDOMECHANICALS) ×4 IMPLANT
RELOAD STAPLE 45 2.5 WHT GRN (ENDOMECHANICALS) ×1 IMPLANT
SET TUBE SMOKE EVAC HIGH FLOW (TUBING) ×2 IMPLANT
SHEARS HARMONIC ACE PLUS 36CM (ENDOMECHANICALS) ×2 IMPLANT
SLEEVE ADV FIXATION 5X100MM (TROCAR) ×2 IMPLANT
SPIKE FLUID TRANSFER (MISCELLANEOUS) ×2 IMPLANT
SPONGE T-LAP 18X18 ~~LOC~~+RFID (SPONGE) ×2 IMPLANT
STAPLER SKIN PROX 35W (STAPLE) ×1 IMPLANT
SUT MNCRL 4-0 (SUTURE) ×2
SUT MNCRL 4-0 27XMFL (SUTURE) ×1
SUT PDS AB 1 CT1 36 (SUTURE) ×2 IMPLANT
SUT VICRYL 0 AB UR-6 (SUTURE) ×1 IMPLANT
SUTURE MNCRL 4-0 27XMF (SUTURE) ×1 IMPLANT
SYS BAG RETRIEVAL 10MM (BASKET) ×1
SYS KII FIOS ACCESS ABD 5X100 (TROCAR) ×2
SYSTEM BAG RETRIEVAL 10MM (BASKET) ×1 IMPLANT
SYSTEM KII FIOS ACES ABD 5X100 (TROCAR) ×1 IMPLANT
TRAY FOLEY MTR SLVR 16FR STAT (SET/KITS/TRAYS/PACK) ×1 IMPLANT
TROCAR ADV FIXATION 12X100MM (TROCAR) ×2 IMPLANT
WATER STERILE IRR 500ML POUR (IV SOLUTION) ×2 IMPLANT

## 2022-04-03 NOTE — Anesthesia Preprocedure Evaluation (Addendum)
Anesthesia Evaluation  ?Patient identified by MRN, date of birth, ID band ?Patient awake ? ? ? ?Reviewed: ?Allergy & Precautions, NPO status , Patient's Chart, lab work & pertinent test results ? ?History of Anesthesia Complications ?Negative for: history of anesthetic complications ? ?Airway ?Mallampati: II ? ? ?Neck ROM: Full ? ? ? Dental ?no notable dental hx. ? ?  ?Pulmonary ?neg pulmonary ROS,  ?  ?Pulmonary exam normal ?breath sounds clear to auscultation ? ? ? ? ? ? Cardiovascular ?Exercise Tolerance: Good ?negative cardio ROS ?Normal cardiovascular exam ?Rhythm:Regular Rate:Normal ? ? ?  ?Neuro/Psych ?negative neurological ROS ?   ? GI/Hepatic ?Acute appendicitis ?  ?Endo/Other  ?Obesity  ? Renal/GU ?negative Renal ROS  ? ?  ?Musculoskeletal ? ? Abdominal ?  ?Peds ? Hematology ?negative hematology ROS ?(+)   ?Anesthesia Other Findings ? ? Reproductive/Obstetrics ? ?  ? ? ? ? ? ? ? ? ? ? ? ? ? ?  ?  ? ? ? ? ? ? ? ?Anesthesia Physical ?Anesthesia Plan ? ?ASA: 2 and emergent ? ?Anesthesia Plan: General  ? ?Post-op Pain Management:   ? ?Induction: Intravenous and Rapid sequence ? ?PONV Risk Score and Plan: 2 and Ondansetron, Dexamethasone and Treatment may vary due to age or medical condition ? ?Airway Management Planned: Oral ETT ? ?Additional Equipment:  ? ?Intra-op Plan:  ? ?Post-operative Plan: Extubation in OR ? ?Informed Consent: I have reviewed the patients History and Physical, chart, labs and discussed the procedure including the risks, benefits and alternatives for the proposed anesthesia with the patient or authorized representative who has indicated his/her understanding and acceptance.  ? ? ? ?Dental advisory given ? ?Plan Discussed with: CRNA ? ?Anesthesia Plan Comments: (Patient consented for risks of anesthesia including but not limited to:  ?- adverse reactions to medications ?- damage to eyes, teeth, lips or other oral mucosa ?- nerve damage due to positioning   ?- sore throat or hoarseness ?- damage to heart, brain, nerves, lungs, other parts of body or loss of life ? ?Informed patient about role of CRNA in peri- and intra-operative care.  Patient voiced understanding.)  ? ? ? ? ? ? ?Anesthesia Quick Evaluation ? ?

## 2022-04-03 NOTE — Progress Notes (Addendum)
Brief Progress Note ?Patient seen the morning of surgery ? ?He is still groggy from OR ?Incisional soreness ?No fever, chills, nausea, emesis ? ?Exam:  ?Somnolent but arouses; family at bedside ?Soft abdomen, Incisional soreness, non-distended ?Laparotomy is CDI with staples, small amount of drainage inferiorly, no erythema ? ?Plan:  ?-- Okay to advance diet as tolerated today ?-- Continue IV Abx (Zosyn) ?-- Monitor abdominal examination; on-going bowel function ?-- Pain control prn; antiemetics prn ?-- Morning labs ?-- Mobilization as tolerated ? ?-- Discharge Planning: given extensive nature of procedure; will keep today. Hopefully DC in AM tomorrow (04/26) if doing well.  ? ?-- ?Edison Simon, PA-C ?[facility] ?04/03/2022, 8:29 AM ?M-F: 7am - 4pm ? ?

## 2022-04-03 NOTE — TOC Initial Note (Signed)
Transition of Care (TOC) - Initial/Assessment Note  ? ? ?Patient Details  ?Name: JOESIAH LONON ?MRN: [id number] ?Date of Birth: 02/01/2001 ? ?Transition of Care (TOC) CM/SW Contact:    ?Chapman Fitch, RN ?Phone Number: ?04/03/2022, 10:26 AM ? ?Clinical Narrative:                 ? ? ?  ?  ?Transition of Care (TOC) Screening Note ? ? ?Patient Details  ?Name: KEZIAH DROTAR ?Date of Birth: [birth year] ? ? ?Transition of Care (TOC) CM/SW Contact:    ?Chapman Fitch, RN ?Phone Number: ?04/03/2022, 10:26 AM ? ? ? ?Transition of Care Department [facility]) has reviewed patient and no TOC needs have been identified at this time. We will continue to monitor patient advancement through interdisciplinary progression rounds. If new patient transition needs arise, please place a TOC consult. ? ? ? ? ?Patient Goals and CMS Choice ?  ?  ?  ? ?Expected Discharge Plan and Services ?  ?  ?  ?  ?  ?                ?  ?  ?  ?  ?  ?  ?  ?  ?  ?  ? ?Prior Living Arrangements/Services ?  ?  ?  ?       ?  ?  ?  ?  ? ?Activities of Daily Living ?Home Assistive Devices/Equipment: None ?ADL Screening (condition at time of admission) ?Patient's cognitive ability adequate to safely complete daily activities?: Yes ?Is the patient deaf or have difficulty hearing?: No ?Does the patient have difficulty seeing, even when wearing glasses/contacts?: No ?Does the patient have difficulty concentrating, remembering, or making decisions?: No ?Patient able to express need for assistance with ADLs?: Yes ?Does the patient have difficulty dressing or bathing?: No ?Independently performs ADLs?: Yes (appropriate for developmental age) ?Does the patient have difficulty walking or climbing stairs?: No ?Weakness of Legs: None ?Weakness of Arms/Hands: None ? ?Permission Sought/Granted ?  ?  ?   ?   ?   ?   ? ?Emotional Assessment ?  ?  ?  ?  ?  ?  ? ?Admission diagnosis:  Acute appendicitis [K35.80] ?Acute appendicitis with generalized peritonitis without gangrene,  unspecified whether abscess present, unspecified whether perforation present [K35.20] ?Patient Active Problem List  ? Diagnosis Date Noted  ? Acute appendicitis 04/02/2022  ? ?PCP:  Galen Manila, NP (Inactive) ?Pharmacy:  No Pharmacies Listed ? ? ? ?Social Determinants of Health (SDOH) Interventions ?  ? ?Readmission Risk Interventions ?   ? View : No data to display.  ?  ?  ?  ? ? ? ?

## 2022-04-03 NOTE — Anesthesia Procedure Notes (Signed)
Procedure Name: Intubation ?Date/Time: 04/03/2022 3:43 AM ?Performed by: Waldo Laine, CRNA ?Pre-anesthesia Checklist: Patient identified, Patient being monitored, Timeout performed, Emergency Drugs available and Suction available ?Patient Re-evaluated:Patient Re-evaluated prior to induction ?Oxygen Delivery Method: Circle system utilized ?Preoxygenation: Pre-oxygenation with 100% oxygen ?Induction Type: IV induction and Rapid sequence ?Laryngoscope Size: 3 and McGraph ?Grade View: Grade I ?Tube type: Oral ?Tube size: 7.5 mm ?Number of attempts: 1 ?Airway Equipment and Method: Stylet ?Placement Confirmation: ETT inserted through vocal cords under direct vision, positive ETCO2 and breath sounds checked- equal and bilateral ?Secured at: 22 cm ?Tube secured with: Tape ?Dental Injury: Teeth and Oropharynx as per pre-operative assessment  ? ? ? ? ?

## 2022-04-03 NOTE — Anesthesia Postprocedure Evaluation (Signed)
Anesthesia Post Note ? ?Patient: Darrell Greer ? ?Procedure(s) Performed: APPENDECTOMY LAPAROSCOPIC Converted to Open Appendectomy (Abdomen) ? ?Patient location during evaluation: PACU ?Anesthesia Type: General ?Level of consciousness: awake and alert, oriented and patient cooperative ?Pain management: pain level controlled ?Vital Signs Assessment: post-procedure vital signs reviewed and stable ?Respiratory status: spontaneous breathing, nonlabored ventilation and respiratory function stable ?Cardiovascular status: blood pressure returned to baseline and stable ?Postop Assessment: adequate PO intake ?Anesthetic complications: no ? ? ?No notable events documented. ? ? ?Last Vitals:  ?Vitals:  ? 04/03/22 0600 04/03/22 0605  ?BP: 118/74   ?Pulse: 95 88  ?Resp: 16 18  ?Temp: 37.6 ?C   ?SpO2: 92% 97%  ?  ?Last Pain:  ?Vitals:  ? 04/03/22 0605  ?TempSrc:   ?PainSc: 7   ? ? ?  ?  ?  ?  ?  ?  ? ?[name] ? ? ? ? ?

## 2022-04-03 NOTE — Transfer of Care (Signed)
Immediate Anesthesia Transfer of Care Note ? ?Patient: Darrell Greer ? ?Procedure(s) Performed: APPENDECTOMY LAPAROSCOPIC Converted to Open Appendectomy (Abdomen) ? ?Patient Location: PACU ? ?Anesthesia Type:General ? ?Level of Consciousness: awake and patient cooperative ? ?Airway & Oxygen Therapy: Patient Spontanous Breathing and Patient connected to face mask oxygen ? ?Post-op Assessment: Report given to RN and Post -op Vital signs reviewed and stable ? ?Post vital signs: Reviewed and stable ? ?Last Vitals:  ?Vitals Value Taken Time  ?BP    ?Temp    ?Pulse    ?Resp    ?SpO2    ? ? ?Last Pain:  ?Vitals:  ? 04/03/22 0002  ?TempSrc:   ?PainSc: 2   ?   ? ?Patients Stated Pain Goal: 0 (04/03/22 0002) ? ?Complications: No notable events documented. ?

## 2022-04-03 NOTE — Op Note (Signed)
Laparascopic appendectomy, conversion to open. ? ?Darrell Greer ?Date of operation:  04/03/2022 ? ?Indications: The patient presented with a weeks history of  abdominal pain. Workup has revealed findings consistent with acute appendicitis. ? ?Pre-operative Diagnosis: Appendicitis, unqualified ? ?Post-operative Diagnosis: Appendicitis, unqualified ? ?Surgeon: Campbell Lerner, M.D., FACS ? ?Anesthesia: General with endotracheal tube ? 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 ?Estimated Blood Loss: 50 mL ?        ?Specimens: appendix ?        ?Complications: None. ? ?Procedure Details  ?The patient was seen again in the preop area. The options of surgery versus observation were reviewed with the patient and/or family. The risks of bleeding, infection, recurrence of symptoms, negative laparoscopy, potential for an open procedure, bowel injury, abscess or infection, were all reviewed as well. The patient was taken to Operating Room, identified as Darrell Greer and the procedure verified as laparoscopic appendectomy. A Time Out was held and the above information confirmed. ?The patient was placed in the supine position and general anesthesia was induced.  Antibiotic prophylaxis was administered pre-op and VT E prophylaxis was in place.  ? ?The abdomen was prepped and draped in a sterile fashion. Local infiltration with 0.25% Marcaine with epi is administered to all incisions.  An infraumbilical incision was made.  A towel grasper is applied for countertraction, and a 5 mm optical trocar was passed into the peritoneal cavity under direct visualization.  Pneumoperitoneum obtained. One 12 mm port in the LLQ, and another 5 mm port were placed under direct visualization. ? ?The appendix was identified.  The appendix was carefully dissected. The mesoappendix was markedly thickened and masslike, it was difficult to appreciate the  appendix from the mesoappendix.  There was dense scarring to the distal ileum, right pelvic sidewall and retroperitoneum.  I was unable to dissect out the appendical cecal junction, with fears that I would call some injury to the distal ileum more to the cecum itself as that point was also very densely scarred posteriorly.  I felt it was prudent to abandon laparoscopic procedure, thinking I may need to do a limited right hemicolectomy. ?A midline incision was made encompassing the 2 port sites and extending it beyond the umbilicus for adequate exposure.  I was able to get my fingers behind the retroperitoneal reactive tissue and lift up the whole ileal cecal mass.  I was then able to finger fracture the distal ileum away from the presumed inflamed appendix, some of the serosa stayed with the appendix but there was no luminal injury to the distal ileum. ?The base of the appendix was dissected out and divided with 2- 45 mm white load Endo GIA. The right lower quadrant and pelvis was then irrigated with  normal saline which was aspirated. Inspection  failed to identify any additional bleeding and there were no signs of bowel injury. Again the right lower quadrant was inspected there was no sign of bleeding or bowel injury. ?The fascia was closed with 1 PDS, all ports were removed, and the skin incisions were approximated with staples.  Honeycomb dressing applied.  The patient tolerated the procedure well, there were no complications. The sponge lap and needle count were correct at the end of the procedure. ? ? ? ?Campbell Lerner, M.D., FACS ?04/03/2022 - 5:16 AM ? ?

## 2022-04-04 ENCOUNTER — Encounter: Payer: Self-pay | Admitting: Surgery

## 2022-04-04 LAB — BASIC METABOLIC PANEL
Anion gap: 7 (ref 5–15)
BUN: 14 mg/dL (ref 6–20)
CO2: 24 mmol/L (ref 22–32)
Calcium: 8.8 mg/dL — ABNORMAL LOW (ref 8.9–10.3)
Chloride: 103 mmol/L (ref 98–111)
Creatinine, Ser: 0.73 mg/dL (ref 0.61–1.24)
GFR, Estimated: >60 mL/min (ref >60)
Glucose, Bld: 131 mg/dL — ABNORMAL HIGH (ref 70–99)
Potassium: 4.4 mmol/L (ref 3.5–5.1)
Sodium: 134 mmol/L — ABNORMAL LOW (ref 135–145)

## 2022-04-04 LAB — CBC
HCT: 40.2 % (ref 39.0–52.0)
Hemoglobin: 13.7 g/dL (ref 13.0–17.0)
MCH: 27.9 pg (ref 26.0–34.0)
MCHC: 34.1 g/dL (ref 30.0–36.0)
MCV: 81.9 fL (ref 80.0–100.0)
Platelets: 342 10*3/uL (ref 150–400)
RBC: 4.91 MIL/uL (ref 4.22–5.81)
RDW: 12.1 % (ref 11.5–15.5)
WBC: 16.5 10*3/uL — ABNORMAL HIGH (ref 4.0–10.5)
nRBC: 0 % (ref 0.0–0.2)

## 2022-04-04 LAB — SURGICAL PATHOLOGY

## 2022-04-04 MED ORDER — SIMETHICONE 80 MG PO CHEW
80.0000 mg | CHEWABLE_TABLET | Freq: Four times a day (QID) | ORAL | Status: DC | PRN
  Administered 2022-04-04 – 2022-04-05 (×3): 80 mg via ORAL
  Filled 2022-04-04 (×3): qty 1

## 2022-04-04 NOTE — Evaluation (Signed)
Physical Therapy Evaluation ?Patient Details ?Name: Darrell Greer ?MRN: [id number] ?DOB: [birth year] ?Today's Date: 04/04/2022 ? ?History of Present Illness ? Pt is a 21 yo male s/p open appendectomy. PMH unremarkable.  ?Clinical Impression ? Patient alert, with family at bedside. Endorsed abdominal pain throughout session, mod-severe pain at times. Pt educated on positioning, early mobility importance, stair training, and log roll technique. Bed mobility performed modI (and then minA to return to supine via log roll). Sit <> stand and pt ambulated ~251ft no AD, independently. Stair navigation with CGA and handrail, no questions at this time. At this time no further acute PT needs PT to sign off. Pt educated on follow up with PT s/p healing or per MD for core strengthening as able.  ?   ?   ? ?Recommendations for follow up therapy are one component of a multi-disciplinary discharge planning process, led by the attending physician.  Recommendations may be updated based on patient status, additional functional criteria and insurance authorization. ? ?Follow Up Recommendations No PT follow up ? ?  ?Assistance Recommended at Discharge Intermittent Supervision/Assistance  ?Patient can return home with the following ?   ? ?  ?Equipment Recommendations None recommended by PT  ?Recommendations for Other Services ?    ?  ?Functional Status Assessment    ? ?  ?Precautions / Restrictions Precautions ?Precautions: None ?Precaution Comments: abdominal incision ?Restrictions ?Weight Bearing Restrictions: No  ? ?  ? ?Mobility ? Bed Mobility ?Overal bed mobility: Needs Assistance ?Bed Mobility: Rolling, Sidelying to Sit, Sit to Sidelying ?Rolling: Modified independent (Device/Increase time) ?Sidelying to sit: Supervision ?  ?  ?Sit to sidelying: HOB elevated, Min assist ?General bed mobility comments: assist with BLE to return to supine to perform log roll technique ?  ? ?Transfers ?Overall transfer level: Modified  independent ?Equipment used: None ?  ?  ?  ?  ?  ?  ?  ?  ?  ? ?Ambulation/Gait ?Ambulation/Gait assistance: Independent ?Gait Distance (Feet): 200 Feet ?Assistive device: None ?Gait Pattern/deviations: WFL(Within Functional Limits) ?  ?  ?  ?  ? ?Stairs ?Stairs: Yes ?Stairs assistance: Supervision ?Stair Management: One rail Right, One rail Left, Step to pattern ?Number of Stairs: 5 ?General stair comments: no physical assistance ? ?Wheelchair Mobility ?  ? ?Modified Rankin (Stroke Patients Only) ?  ? ?  ? ?Balance Overall balance assessment: No apparent balance deficits (not formally assessed) ?  ?  ?  ?  ?  ?  ?  ?  ?  ?  ?  ?  ?  ?  ?  ?  ?  ?  ?   ? ? ? ?Pertinent Vitals/Pain Pain Assessment ?Pain Assessment: Faces ?Faces Pain Scale: Hurts even more ?Pain Location: abdomen ?Pain Descriptors / Indicators: Grimacing, Moaning, Guarding ?Pain Intervention(s): Limited activity within patient's tolerance, Monitored during session, Repositioned  ? ? ?Home Living Family/patient expects to be discharged to:: Private residence ?Living Arrangements: Parent ?Available Help at Discharge: Family;Available 24 hours/day ?Type of Home: House ?Home Access: Stairs to enter ?Entrance Stairs-Rails: None ?Entrance Stairs-Number of Steps: 3 ?Alternate Level Stairs-Number of Steps: 12 ?Home Layout: Two level ?Home Equipment: None ?   ?  ?Prior Function Prior Level of Function : Independent/Modified Independent ?  ?  ?  ?  ?  ?  ?  ?  ?  ? ? ?Hand Dominance  ? Dominant Hand: Left ? ?  ?Extremity/Trunk Assessment  ? Upper Extremity Assessment ?Upper Extremity Assessment: Overall [facility]  for tasks assessed ?  ? ?Lower Extremity Assessment ?Lower Extremity Assessment: Overall WFL for tasks assessed ?  ? ?Cervical / Trunk Assessment ?Cervical / Trunk Assessment: Other exceptions ?Cervical / Trunk Exceptions: abdominal surgery  ?Communication  ?    ?Cognition Arousal/Alertness: Awake/alert ?Behavior During Therapy: [facility] for tasks  assessed/performed ?Overall Cognitive Status: Within Functional Limits for tasks assessed ?  ?  ?  ?  ?  ?  ?  ?  ?  ?  ?  ?  ?  ?  ?  ?  ?  ?  ?  ? ?  ?General Comments   ? ?  ?Exercises    ? ?Assessment/Plan  ?  ?PT Assessment Patient does not need any further PT services  ?PT Problem List   ? ?   ?  ?PT Treatment Interventions     ? ?PT Goals (Current goals can be found in the Care Plan section)  ?  ? ?  ?Frequency   ?  ? ? ?Co-evaluation   ?  ?  ?  ?  ? ? ?  ?AM-PAC PT "6 Clicks" Mobility  ?Outcome Measure Help needed turning from your back to your side while in a flat bed without using bedrails?: A Little ?Help needed moving from lying on your back to sitting on the side of a flat bed without using bedrails?: A Little ?Help needed moving to and from a bed to a chair (including a wheelchair)?: None ?  ?Help needed to walk in hospital room?: None ?Help needed climbing 3-5 steps with a railing? : None ?6 Click Score: 18 ? ?  ?End of Session   ?Activity Tolerance: Patient tolerated treatment well ?Patient left: in bed;with call bell/phone within reach;with bed alarm set ?Nurse Communication: Mobility status ?PT Visit Diagnosis: Other abnormalities of gait and mobility (R26.89) ?  ? ?Time: 2035-5974 ?PT Time Calculation (min) (ACUTE ONLY): 29 min ? ? ?Charges:   PT Evaluation ?$PT Eval Low Complexity: 1 Low ?PT Treatments ?$Therapeutic Activity: 23-37 mins ?  ?   ? ? ?Olga Coaster PT, DPT ?2:50 PM,04/04/22 ? ?

## 2022-04-04 NOTE — Discharge Summary (Deleted)
Error

## 2022-04-04 NOTE — Discharge Instructions (Addendum)
In addition to included general post-operative instructions, ? ?Diet: Resume home diet.  ? ?Activity: No heavy lifting >20 pounds (children, pets, laundry, garbage) for 6 weeks from date of surgery, but light activity and walking are encouraged. Do not drive or drink alcohol if taking narcotic pain medications or having pain that might distract from driving. ? ?Wound care: You may shower/get incision wet with soapy water and pat dry (do not rub incisions), but no baths or submerging incision underwater until follow-up.  ? ?Medications: Resume all home medications. For mild to moderate pain: acetaminophen (Tylenol) or ibuprofen/naproxen (if no kidney disease). Combining Tylenol with alcohol can substantially increase your risk of causing liver disease. Narcotic pain medications, if prescribed, can be used for severe pain, though may cause nausea, constipation, and drowsiness. Do not combine Tylenol and Percocet (or similar) within a 6 hour period as Percocet (and similar) contain(s) Tylenol. If you do not need the narcotic pain medication, you do not need to fill the prescription. ? ?Call office [phone] / [phone]) at any time if any questions, worsening pain, fevers/chills, bleeding, drainage from incision site, or other concerns. ? ?

## 2022-04-04 NOTE — Progress Notes (Signed)
[facility] ?SURGICAL PROGRESS NOTE ? ?Hospital Day(s): 1.  ? ?Post op day(s): 1 Day Post-Op.  ? ?Interval History:  ?Patient seen and examined ?No acute events or new complaints overnight.  ?Patient reports he continues to have significant incisional soreness; worse with moving ?No fever, chills, nausea, emesis ?Leukocytosis to 16.5K ?Renal function; sCr - 0.73; UO - 950 cs ?No electrolyte derangements  ?Diet advanced today ?Continues on Zosyn  ? ?Vital signs in last 24 hours: [min-max] current  ?Temp:  [97.5 ?F (36.4 ?C)-98.3 ?F (36.8 ?C)] 97.9 ?F (36.6 ?C) (04/26 0730) ?Pulse Rate:  [72-97] 72 (04/26 0730) ?Resp:  [18-20] 18 (04/26 0730) ?BP: (111-131)/(59-77) 111/59 (04/26 0730) ?SpO2:  [93 %-97 %] 97 % (04/26 0730)     Height: 5\' 10"  (177.8 cm) Weight: 109.8 kg BMI (Calculated): 34.72  ? ?Intake/Output last 2 shifts:  ?04/25 0701 - 04/26 0700 ?In: 1539.8 [P.O.:120; I.V.:1208.9; IV Piggyback:211] ?Out: 950 [Urine:950]  ? ?Physical Exam:  ?Constitutional: alert, cooperative and no distress  ?Respiratory: breathing non-labored at rest  ?Cardiovascular: regular rate and sinus rhythm  ?Gastrointestinal: Soft abdomen, Incisional soreness, non-distended ?Integumentary: Laparotomy is CDI with staples, small amount of drainage inferiorly, no erythema ? ?Labs:  ? ?  Latest Ref Rng & Units 04/04/2022  ?  4:00 AM 04/02/2022  ?  3:13 PM  ?CBC  ?WBC 4.0 - 10.5 K/uL 16.5   13.0    ?Hemoglobin 13.0 - 17.0 g/dL 13.7   14.9    ?Hematocrit 39.0 - 52.0 % 40.2   43.8    ?Platelets 150 - 400 K/uL 342   349    ? ? ?  Latest Ref Rng & Units 04/04/2022  ?  4:00 AM 04/02/2022  ?  3:13 PM  ?CMP  ?Glucose 70 - 99 mg/dL 131   98    ?BUN 6 - 20 mg/dL 14   12    ?Creatinine 0.61 - 1.24 mg/dL 0.73   0.87    ?Sodium 135 - 145 mmol/L 134   136    ?Potassium 3.5 - 5.1 mmol/L 4.4   4.0    ?Chloride 98 - 111 mmol/L 103   103    ?CO2 22 - 32 mmol/L 24   25    ?Calcium 8.9 - 10.3 mg/dL 8.8   9.4    ?Total Protein 6.5 - 8.1 g/dL  7.6     ?Total Bilirubin 0.3 - 1.2 mg/dL  1.0    ?Alkaline Phos 38 - 126 U/L  75    ?AST 15 - 41 U/L  21    ?ALT 0 - 44 U/L  32    ? ? ?Imaging studies: No new pertinent imaging studies ? ? ?Assessment/Plan:  ?21 y.o. male 1 Day Post-Op s/p open appendectomy for acute appendicitis  ? ? - Advance to regular diet  ? - Discontinue IVF ?- Continue IV Abx (Zosyn) ?- Monitor abdominal examination; on-going bowel function ?- Pain control prn; antiemetics prn ?- Morning labs ?- Mobilization as tolerated; engage PT   ?   ? - Discharge Planning; Patient worried about home today due to pain, will engage PT, discontinue IVF, advance diet. Plan for home tomorrow if amenable   ? ?All of the above findings and recommendations were discussed with the patient, and the medical team, and all of patient's questions were answered to his expressed satisfaction. ? ?-- ?Edison Simon, PA-C ?[facility] ?04/04/2022, 9:32 AM ?M-F: 7am - 4pm ? ?

## 2022-04-05 MED ORDER — AMOXICILLIN-POT CLAVULANATE 875-125 MG PO TABS: 1.0000 | ORAL_TABLET | Freq: Two times a day (BID) | ORAL | 0 refills | Status: AC

## 2022-04-05 MED ORDER — IBUPROFEN 600 MG PO TABS: 600.0000 mg | ORAL_TABLET | Freq: Four times a day (QID) | ORAL | 0 refills | Status: AC | PRN

## 2022-04-05 MED ORDER — OXYCODONE HCL 5 MG PO TABS: 5.0000 mg | ORAL_TABLET | Freq: Four times a day (QID) | ORAL | 0 refills | Status: DC | PRN

## 2022-04-05 MED ORDER — METHOCARBAMOL 500 MG PO TABS: 500.0000 mg | ORAL_TABLET | Freq: Three times a day (TID) | ORAL | 0 refills | Status: DC | PRN

## 2022-04-05 NOTE — Plan of Care (Signed)
VSS, patient alert and oriented.  Discharge instructions reviewed and patient discharged to home with family. ?

## 2022-04-05 NOTE — Plan of Care (Signed)
?  Problem: Clinical Measurements: ?Goal: Ability to maintain clinical measurements within normal limits will improve ?Outcome: Progressing ?Goal: Will remain free from infection ?Outcome: Progressing ?Goal: Diagnostic test results will improve ?Outcome: Progressing ?Goal: Respiratory complications will improve ?Outcome: Progressing ?Goal: Cardiovascular complication will be avoided ?Outcome: Progressing ?  ?Problem: Pain Managment: ?Goal: General experience of comfort will improve ?Outcome: Progressing ?  ?V/S stable. Complained of surgical pain; oxycodone and robaxin given with relief. Zofran given for nausea. Pt is passing gas but no BM yet. Walks around the hallway several times.  ?

## 2022-04-05 NOTE — Discharge Summary (Signed)
[facility] ?SURGICAL DISCHARGE SUMMARY ? ?Patient ID: ?Darrell Greer ?MRN: [id number] ?DOB/AGE: [birth year] 20 y.o. ? ?Admit date: 04/02/2022 ?Discharge date: 04/05/2022 ? ?Discharge Diagnoses ?Patient Active Problem List  ? Diagnosis Date Noted  ? 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 Acute appendicitis 04/02/2022  ? ? ?Consultants ?None ? ?Procedures ?04/03/2022:  ?Attempted laparoscopic appendectomy converted to open appendectomy  ? ?HPI: 21 y.o. male presented to [facility] ED today for abdominal pain. Patient reports RLQ abdominal pain for the last 2 weeks. He thought initially he pulled a muscle but the pain has persisted over this time. No fever, chills, CP, nausea, emesis, bowel change, or urinary change. No history of similar. No previous intra-abdominal surgeries. Work up n the ED revealed a leukocytosis to 13.0K but laboratory work up was otherwise reassuring. CT Abdomen/Pelvis was concerning for acute uncomplicated appendicitis.  ? ?Hospital Course: Informed consent was obtained and documented, and patient underwent laparoscopic appendectomy converted to open (Dr Claudine Mouton, MD, 04/03/2022).  Post-operatively, patient did reasonably well. Pain control was biggest barrier initially. Advancement of patient's diet and ambulation were well-tolerated. The remainder of patient's hospital course was essentially unremarkable, and discharge planning was initiated accordingly with patient safely able to be discharged home with appropriate discharge instructions, antibiotics (Augmentin x7 days), pain control, and outpatient follow-up after all of his questions were answered to his expressed satisfaction.  ? ?Discharge Condition: Good ? ? ?Physical Examination:  ?Constitutional: alert, cooperative and no distress  ?Respiratory: breathing non-labored at rest  ?Cardiovascular: regular rate and sinus rhythm  ?Gastrointestinal: Soft abdomen, Incisional soreness, non-distended ?Integumentary: Laparotomy is CDI with  staples, no drainage or erythema  ? ? ?Allergies as of 04/05/2022   ? ?   Reactions  ? Cefdinir Diarrhea, Nausea And Vomiting, Other (See Comments)  ? Other reaction(s): GI Intolerance, GI Upset (intolerance)  ? ?  ? ?  ?Medication List  ?  ? ?TAKE these medications   ? ?amoxicillin-clavulanate 875-125 MG tablet ?Commonly known as: Augmentin ?Take 1 tablet by mouth 2 (two) times daily for 7 days. ?  ?ibuprofen 600 MG tablet ?Commonly known as: ADVIL ?Take 1 tablet (600 mg total) by mouth every 6 (six) hours as needed. ?What changed:  ?medication strength ?how much to take ?when to take this ?  ?loratadine 10 MG tablet ?Commonly known as: CLARITIN ?Take 10 mg by mouth daily. ?  ?methocarbamol 500 MG tablet ?Commonly known as: ROBAXIN ?Take 1 tablet (500 mg total) by mouth every 8 (eight) hours as needed for muscle spasms. ?  ?Novavax COVID-19 Vaccine 5 MCG/0.5ML injection ?Generic drug: COVID-19 adjuvanted vaccine (NOVAVAX) ?Inject into the muscle. ?  ?oxyCODONE 5 MG immediate release tablet ?Commonly known as: Oxy IR/ROXICODONE ?Take 1 tablet (5 mg total) by mouth every 6 (six) hours as needed for severe pain or breakthrough pain. ?  ?pseudoephedrine 120 MG 12 hr tablet ?Commonly known as: SUDAFED ?Take 120 mg by mouth 2 (two) times daily as needed for congestion. ?  ? ?  ? ? ? ? Follow-up Information   ? ? Donovan Kail, PA-C Follow up in 10 day(s).   ?Specialty: Physician Assistant ?Why: Follow up in 7-10 days, s/p open appendectomy ?Contact information: ?[address] ?[address] ?[place] [address] ?[phone] ? ? ?  ?  ? ?  ?  ? ?  ? ? ? ?Time spent on discharge management including discussion of hospital course, clinical condition, outpatient instructions, prescriptions, and follow up with the patient and members of the  medical team: >30 minutes ? ?-- ?Lynden Oxford , PA-C ?[facility]  ?04/05/2022, 10:01 AM ?[phone] ?M-F: 7am - 4pm ? ?

## 2022-04-07 ENCOUNTER — Other Ambulatory Visit: Payer: Self-pay

## 2022-04-07 ENCOUNTER — Emergency Department
Admission: EM | Admit: 2022-04-07 | Discharge: 2022-04-07 | Disposition: A | Discharge status: Home / Self Care | Payer: Managed Care, Other (non HMO) | Attending: Emergency Medicine | Admitting: Emergency Medicine

## 2022-04-07 ENCOUNTER — Encounter: Payer: Self-pay | Admitting: Emergency Medicine

## 2022-04-07 ENCOUNTER — Emergency Department: Payer: Managed Care, Other (non HMO)

## 2022-04-07 DIAGNOSIS — R1030 Lower abdominal pain, unspecified: Secondary | ICD-10-CM | POA: Insufficient documentation

## 2022-04-07 DIAGNOSIS — Z9089 Acquired absence of other organs: Secondary | ICD-10-CM | POA: Diagnosis not present

## 2022-04-07 DIAGNOSIS — G8918 Other acute postprocedural pain: Secondary | ICD-10-CM | POA: Insufficient documentation

## 2022-04-07 LAB — COMPREHENSIVE METABOLIC PANEL
ALT: 29 U/L (ref 0–44)
AST: 23 U/L (ref 15–41)
Albumin: 3.1 g/dL — ABNORMAL LOW (ref 3.5–5.0)
Alkaline Phosphatase: 62 U/L (ref 38–126)
Anion gap: 6 (ref 5–15)
BUN: 13 mg/dL (ref 6–20)
CO2: 27 mmol/L (ref 22–32)
Calcium: 8.6 mg/dL — ABNORMAL LOW (ref 8.9–10.3)
Chloride: 106 mmol/L (ref 98–111)
Creatinine, Ser: 0.78 mg/dL (ref 0.61–1.24)
GFR, Estimated: >60 mL/min (ref >60)
Glucose, Bld: 88 mg/dL (ref 70–99)
Potassium: 3.5 mmol/L (ref 3.5–5.1)
Sodium: 139 mmol/L (ref 135–145)
Total Bilirubin: 0.7 mg/dL (ref 0.3–1.2)
Total Protein: 6.8 g/dL (ref 6.5–8.1)

## 2022-04-07 LAB — CBC
HCT: 40.2 % (ref 39.0–52.0)
Hemoglobin: 13.5 g/dL (ref 13.0–17.0)
MCH: 28 pg (ref 26.0–34.0)
MCHC: 33.6 g/dL (ref 30.0–36.0)
MCV: 83.4 fL (ref 80.0–100.0)
Platelets: 375 10*3/uL (ref 150–400)
RBC: 4.82 MIL/uL (ref 4.22–5.81)
RDW: 12.2 % (ref 11.5–15.5)
WBC: 7.9 10*3/uL (ref 4.0–10.5)
nRBC: 0 % (ref 0.0–0.2)

## 2022-04-07 MED ORDER — PIPERACILLIN-TAZOBACTAM 3.375 G IVPB 30 MIN
3.3750 g | Freq: Once | INTRAVENOUS | Status: AC
  Administered 2022-04-07: 3.375 g via INTRAVENOUS
  Filled 2022-04-07: qty 50

## 2022-04-07 MED ORDER — AZITHROMYCIN 250 MG PO TABS: ORAL_TABLET | ORAL | 0 refills | Status: DC

## 2022-04-07 MED ORDER — HYDROMORPHONE HCL 1 MG/ML IJ SOLN
1.0000 mg | Freq: Once | INTRAMUSCULAR | Status: AC
  Administered 2022-04-07: 1 mg via INTRAVENOUS
  Filled 2022-04-07: qty 1

## 2022-04-07 MED ORDER — MUPIROCIN 2 % EX OINT: 1.0000 "application " | TOPICAL_OINTMENT | Freq: Two times a day (BID) | CUTANEOUS | 0 refills | Status: AC

## 2022-04-07 MED ORDER — ONDANSETRON HCL 4 MG/2ML IJ SOLN
4.0000 mg | Freq: Once | INTRAMUSCULAR | Status: AC
  Administered 2022-04-07: 4 mg via INTRAVENOUS
  Filled 2022-04-07: qty 2

## 2022-04-07 MED ORDER — KETOROLAC TROMETHAMINE 30 MG/ML IJ SOLN
15.0000 mg | Freq: Once | INTRAMUSCULAR | Status: AC
Start: 2022-04-07 — End: 2022-04-07
  Administered 2022-04-07: 15 mg via INTRAVENOUS
  Filled 2022-04-07: qty 1

## 2022-04-07 MED ORDER — IOHEXOL 300 MG/ML  SOLN
100.0000 mL | Freq: Once | INTRAMUSCULAR | Status: AC | PRN
  Administered 2022-04-07: 100 mL via INTRAVENOUS

## 2022-04-07 NOTE — ED Notes (Signed)
Dc ppw provided. Pt denies any questions. Followup and rx information reviewed. Pt provides verbal consent for dc. Pt ambulatory off unit on foot. Pt to lobby on foot alert and oriented.  ?

## 2022-04-07 NOTE — ED Provider Notes (Signed)
Assumed care from Dr. Lenard Lance. See his note for details. Briefly, pt is 21 yo M here with abdominal pain, concern for redness at surgical site s/p recent appendectomy. The surgical site appears clean, dry, and intact on my assessment, with mild erythema around the staple edges but no induration, purulence, fluctuance, or signs to suggest skin infection. CT A/P obtained, reviewed, shows expected post-op inflammation but no appreciable abscess or complications. He is HDS. WBC normal. Discussed labs, imaging with Dr. Claudine Mouton, pt's surgeon. Given well appearance, will f/u in clinic with continued pain management. Of note, CT read as ? Atelectasis versus PNA - clinically, no signs of PNA but will add azithro in the event of possible early CAP. Return precautions given. Mupirocin given for local wound care. ?  ?Darrell Pollack, MD ?04/08/22 0143 ? ?

## 2022-04-07 NOTE — Discharge Instructions (Addendum)
Continue the augmentin, and ADD the mupirocin ointment and azithromycin for broader coverage ? ? ?

## 2022-04-07 NOTE — ED Triage Notes (Signed)
Pt reports had an appendectomy Tuesday and continues to have pain and now as an increase in drainage so his surgeon advised him to come to the ED for evaluation ?

## 2022-04-07 NOTE — ED Notes (Signed)
Pt arriving today with concern for surgical site infection. Pt incision appears red and swollen with yellow drainage. Pt alert and oriented in stretcher with NAD at this time, pt family at bedside.  ?

## 2022-04-07 NOTE — ED Provider Notes (Signed)
? ?  [facility] ?Provider Note ? ? ? Event Date/Time  ? First MD Initiated Contact with Patient 04/07/22 1434   ?  (approximate) ? ?History  ? ?Chief Complaint: Post-op Problem ? ?HPI ? ?Darrell Greer is a 21 y.o. male with a recent appendectomy 4 days ago who presents to the emergency department for worsening pain and incisional discharge.  According to the patient on Tuesday he had an open appendectomy by Dr. Christian Mate.  He states during the surgery there was some complications requiring it to be an open appendectomy instead of a laparoscopic appendectomy.  Patient states he has noticed over the past 24 hours some redness around the incision as well as some drainage from the incision.  States he called his surgeon today recommended to come to the emergency department for evaluation. ? ?Physical Exam  ? ?Triage Vital Signs: ?ED Triage Vitals  ?Enc Vitals Group  ?   BP 04/07/22 1425 (!) 143/85  ?   Pulse Rate 04/07/22 1425 96  ?   Resp 04/07/22 1425 16  ?   Temp 04/07/22 1425 97.8 ?F (36.6 ?C)  ?   Temp Source 04/07/22 1425 Oral  ?   SpO2 04/07/22 1425 98 %  ?   Weight 04/07/22 1419 240 lb 4.8 oz (109 kg)  ?   Height 04/07/22 1419 5\' 10"  (1.778 m)  ?   Head Circumference --   ?   Peak Flow --   ?   Pain Score 04/07/22 1419 7  ?   Pain Loc --   ?   Pain Edu? --   ?   Excl. in [place]? --   ? ? ?Most recent vital signs: ?Vitals:  ? 04/07/22 1425  ?BP: (!) 143/85  ?Pulse: 96  ?Resp: 16  ?Temp: 97.8 ?F (36.6 ?C)  ?SpO2: 98%  ? ? ?General: Awake, no distress.  ?CV:  Good peripheral perfusion.  Regular rate and rhythm  ?Resp:  Normal effort.  Equal breath sounds bilaterally.  ?Abd:  No distention.  Soft, mild lower abdominal tenderness to palpation around the surgical incision very slight amount of erythema around the superior aspect of the incision no current drainage.  No dehiscence, staples in place. ? ? ?ED Results / Procedures / Treatments  ? ? ?RADIOLOGY ? ?CT pending ? ? ?MEDICATIONS ORDERED IN  ED: ?Medications - No data to display ? ? ?IMPRESSION / MDM / ASSESSMENT AND PLAN / ED COURSE  ?I reviewed the triage vital signs and the nursing notes. ? ?Patient presents emergency department for lower abdominal discomfort and incisional drainage.  According to the patient he has been experiencing some mild increase in his lower abdominal pain over the past 24 hours noted some redness and drainage around the incision was told to come to the ED for further evaluation.  We will check labs obtain CT imaging of abdomen/pelvis with contrast to further evaluate. ? ?Labs and CT pending.  Patient care signed out to oncoming provider ? ?FINAL CLINICAL IMPRESSION(S) / ED DIAGNOSES  ? ?Postsurgical pain ? ? ?Note:  This document was prepared using Dragon voice recognition software and may include unintentional dictation errors. ?  ?Harvest Dark, MD ?04/07/22 1439 ? ?

## 2022-04-12 ENCOUNTER — Other Ambulatory Visit: Payer: Self-pay

## 2022-04-12 ENCOUNTER — Ambulatory Visit (INDEPENDENT_AMBULATORY_CARE_PROVIDER_SITE_OTHER): Payer: Managed Care, Other (non HMO) | Admitting: Physician Assistant

## 2022-04-12 ENCOUNTER — Encounter: Payer: Self-pay | Admitting: Physician Assistant

## 2022-04-12 VITALS — BP 129/80 | HR 78 | Temp 97.9°F | Ht 70.0 in | Wt 238.0 lb

## 2022-04-12 DIAGNOSIS — K353 Acute appendicitis with localized peritonitis, without perforation or gangrene: Secondary | ICD-10-CM

## 2022-04-12 DIAGNOSIS — K352 Acute appendicitis with generalized peritonitis, without abscess: Secondary | ICD-10-CM

## 2022-04-12 DIAGNOSIS — Z09 Encounter for follow-up examination after completed treatment for conditions other than malignant neoplasm: Secondary | ICD-10-CM

## 2022-04-12 NOTE — Patient Instructions (Addendum)
Please see your follow up appointment listed below.   GENERAL POST-OPERATIVE PATIENT INSTRUCTIONS   WOUND CARE INSTRUCTIONS:  Keep a dry clean dressing on the wound if there is drainage. The initial bandage may be removed after 24 hours.  Once the wound has quit draining you may leave it open to air.  If clothing rubs against the wound or causes irritation and the wound is not draining you may cover it with a dry dressing during the daytime.  Try to keep the wound dry and avoid ointments on the wound unless directed to do so.  If the wound becomes bright red and painful or starts to drain infected material that is not clear, please contact your physician immediately.  If the wound is mildly pink and has a thick firm ridge underneath it, this is normal, and is referred to as a healing ridge.  This will resolve over the next 4-6 weeks.  BATHING: You may shower if you have been informed of this by your surgeon. However, Please do not submerge in a tub, hot tub, or pool until incisions are completely sealed or have been told by your surgeon that you may do so.  DIET:  You may eat any foods that you can tolerate.  It is a good idea to eat a high fiber diet and take in plenty of fluids to prevent constipation.  If you do become constipated you may want to take a mild laxative or take ducolax tablets on a daily basis until your bowel habits are regular.  Constipation can be very uncomfortable, along with straining, after recent surgery.  ACTIVITY:  You are encouraged to cough and deep breath or use your incentive spirometer if you were given one, every 15-30 minutes when awake.  This will help prevent respiratory complications and low grade fevers post-operatively if you had a general anesthetic.  You may want to hug a pillow when coughing and sneezing to add additional support to the surgical area, if you had abdominal or chest surgery, which will decrease pain during these times.  You are encouraged to walk  and engage in light activity for the next two weeks.  You should not lift more than 20 pounds for 6 weeks total after surgery as it could put you at increased risk for complications.  Twenty pounds is roughly equivalent to a plastic bag of groceries. At that time- Listen to your body when lifting, if you have pain when lifting, stop and then try again in a few days. Soreness after doing exercises or activities of daily living is normal as you get back in to your normal routine.  MEDICATIONS:  Try to take narcotic medications and anti-inflammatory medications, such as tylenol, ibuprofen, naprosyn, etc., with food.  This will minimize stomach upset from the medication.  Should you develop nausea and vomiting from the pain medication, or develop a rash, please discontinue the medication and contact your physician.  You should not drive, make important decisions, or operate machinery when taking narcotic pain medication.  SUNBLOCK Use sun block to incision area over the next year if this area will be exposed to sun. This helps decrease scarring and will allow you avoid a permanent darkened area over your incision.  QUESTIONS:  Please feel free to call our office if you have any questions, and we will be glad to assist you. [phone]   

## 2022-04-12 NOTE — Progress Notes (Signed)
[facility] ?POST-OP OFFICE VISIT ? ?04/12/2022 ? ?HPI: ?Darrell Greer is a 21 y.o. male 9 days s/p open appendectomy for acute appendicitis with Dr Claudine Mouton ? ?He did go to the ED on 04/29 secondary to erythema surrounding his incision. Laboratory work up reviewed and reassuring. He did also under CT Abdomen/Pelvis which showed expected post-operative changes; no abscess or wound infection.  ? ?He is doing mch better this afternoon ?Abdominal discomfort is much improved; most of the irritation is from staples ?No fever, chills, nausea, emesis ?Good appetite and normal bowel function ?No other complaints  ? ?Vital signs: ?BP 129/80   Pulse 78   Temp 97.9 ?F (36.6 ?C) (Oral)   Ht 5\' 10"  (1.778 m)   Wt 238 lb (108 kg)   SpO2 99%   BMI 34.15 kg/m?   ? ?Physical Exam: ?Constitutional: Well appearing male, NAD ?Abdomen: Soft, non-tender, non-distended, no rebound/guarding ?Skin: Laparotomy incision is healing well with stapls (removed), no erythema, some ecchymosis ? ?Assessment/Plan: ?This is a 21 y.o. male 9 days s/p open appendectomy for acute appendicitis  ? ? - Staples removed; steri-strips placed ? - Pain control prn ? - Reviewed wound care recommendation ? - Reviewed lifting restrictions; 6 weeks total ? - Reviewed surgical pathology; Appendicitis ? - He will follow up in 2 weeks ? ?-- ?26, PA-C ? [facility] ?04/12/2022, 3:30 PM ?M-F: 7am - 4pm ? ?

## 2022-04-12 NOTE — Progress Notes (Signed)
Patient returned to the office later in the afternoon secondary to concerns over bleeding from the lower portion of his incision. He reports that he got out of the car and noticed blood oozing from the wound and down his shirt. He applied pressure and returned to the office.  ? ?On exam, it appears a small 1-2 cm area inferior to his umbilicus had opened up. There is minimal depth to this. This is no longer bleeding. There is ecchymosis in this area. I suspect he had an underlying hematoma or seroma in this area that broke through. No evidence of infection.  ? ?Plan:  ?-- No longer bleeding ?-- Will need superficial dressings daily; taught wound care ?-- Continue to follow up as scheduled. Call if needed sooner ? ?-- ?Edison Simon, PA-C ?[facility] ?04/12/2022, 4:42 PM ?M-F: 7am - 4pm  ?

## 2022-04-13 ENCOUNTER — Telehealth: Payer: Self-pay

## 2022-04-13 MED ORDER — SULFAMETHOXAZOLE-TRIMETHOPRIM 400-80 MG PO TABS: 1.0000 | ORAL_TABLET | Freq: Two times a day (BID) | ORAL | 0 refills | Status: AC

## 2022-04-13 NOTE — Telephone Encounter (Signed)
Bactrim sent to pharmacy-patient notified. Patient called office today stating the redness is worsening and the drainage color has changed. Spoke with Thedore Mins -add to schedule next week.  ?

## 2022-04-17 ENCOUNTER — Encounter: Payer: Self-pay | Admitting: Physician Assistant

## 2022-04-17 ENCOUNTER — Ambulatory Visit (INDEPENDENT_AMBULATORY_CARE_PROVIDER_SITE_OTHER): Payer: Managed Care, Other (non HMO) | Admitting: Physician Assistant

## 2022-04-17 VITALS — BP 133/80 | HR 112 | Temp 98.7°F | Wt 233.4 lb

## 2022-04-17 DIAGNOSIS — K353 Acute appendicitis with localized peritonitis, without perforation or gangrene: Secondary | ICD-10-CM

## 2022-04-17 DIAGNOSIS — Z09 Encounter for follow-up examination after completed treatment for conditions other than malignant neoplasm: Secondary | ICD-10-CM

## 2022-04-17 DIAGNOSIS — K352 Acute appendicitis with generalized peritonitis, without abscess: Secondary | ICD-10-CM

## 2022-04-17 NOTE — Progress Notes (Signed)
[facility] ?POST-OP OFFICE VISIT ? ?04/17/2022 ? ?HPI: ?Darrell Greer is a 21 y.o. male 14 days s/p open appendectomy for acute appendicitis with Dr Christian Mate ? ?He called the office on Friday with concerns over erythema at the incision sit; was sent bactrim for this. Today, he reports that all the redness is gone. He thinks this may have been secondary to the Mastisol used with the steri-strips. He scrubbed this off and the redness resolved.  ?Otherwise pain is doing reasonably well ?No fever, chills, nausea, emesis ?No other complaints ? ?Vital signs: ?BP 133/80   Pulse (!) 112   Temp 98.7 ?F (37.1 ?C) (Oral)   Wt 233 lb 6.4 oz (105.9 kg)   SpO2 97%   BMI 33.49 kg/m?   ? ?Physical Exam: ?Constitutional: Well appearing male, NAD ?Abdomen: Soft, non-tender, non-distended, no rebound/guarding ?Skin: Laparotomy incision is healing well, there remains a small 1-2 cm area inferior to his umbilicus that is healing via secondary intention, wound bed is >90% granulation tissue, no drainage, no other erythema ? ?Assessment/Plan: ?This is a 21 y.o. male 14 days s/p open appendectomy for acute appendicitis  ? ? - Pain control prn ? - Reviewed wound care recommendation; cover with dry gauze tape; okay to shower; no bathing  ? - Reviewed lifting restrictions; 4-6 weeks total ? - He will follow up on 05/18 as scheduled  ? ?-- ?Edison Simon, PA-C ?[facility] ?04/17/2022, 2:29 PM ?M-F: 7am - 4pm ? ?

## 2022-04-17 NOTE — Patient Instructions (Signed)

## 2022-04-18 ENCOUNTER — Other Ambulatory Visit: Payer: Self-pay

## 2022-04-19 ENCOUNTER — Ambulatory Visit: Payer: Managed Care, Other (non HMO) | Attending: Internal Medicine

## 2022-04-19 ENCOUNTER — Other Ambulatory Visit: Payer: Self-pay

## 2022-04-19 DIAGNOSIS — Z23 Encounter for immunization: Secondary | ICD-10-CM

## 2022-04-19 MED ORDER — NOVAVAX COVID-19 VACCINE 5 MCG/0.5ML IM SUSP
INTRAMUSCULAR | 0 refills | Status: AC
  Filled 2022-04-19: qty 0.5, 1d supply, fill #0

## 2022-04-19 NOTE — Progress Notes (Signed)
? ?  Covid-19 Vaccination Clinic ? ?Name:  Darrell Greer    ?MRN: [id number] ?DOB: 2001/08/08 ? ?04/19/2022 ? ?[name] was observed post Covid-19 immunization for 15 minutes without incident. He was provided with Vaccine Information Sheet and instruction to access the V-Safe system.  ? ?[name] was instructed to call 911 with any severe reactions post vaccine: ?Difficulty breathing  ?Swelling of face and throat  ?A fast heartbeat  ?A bad rash all over body  ?Dizziness and weakness  ? ?Immunizations Administered   ? ? Name Date Dose VIS Date Route  ? Novavax(covid-19) Vaccine 04/19/2022  9:20 AM 0.5 mL 07/19/2021 Intramuscular  ? Manufacturer: [employer]  ? Lot: [id number]  ? NDC: 10258-527-78  ? ?  ? ?

## 2022-04-26 ENCOUNTER — Ambulatory Visit (INDEPENDENT_AMBULATORY_CARE_PROVIDER_SITE_OTHER): Payer: Managed Care, Other (non HMO) | Admitting: Physician Assistant

## 2022-04-26 ENCOUNTER — Encounter: Payer: Self-pay | Admitting: Physician Assistant

## 2022-04-26 VITALS — BP 129/83 | HR 98 | Temp 98.5°F | Ht 70.0 in | Wt 238.0 lb

## 2022-04-26 DIAGNOSIS — K352 Acute appendicitis with generalized peritonitis, without abscess: Secondary | ICD-10-CM

## 2022-04-26 DIAGNOSIS — K353 Acute appendicitis with localized peritonitis, without perforation or gangrene: Secondary | ICD-10-CM

## 2022-04-26 DIAGNOSIS — Z09 Encounter for follow-up examination after completed treatment for conditions other than malignant neoplasm: Secondary | ICD-10-CM

## 2022-04-26 NOTE — Progress Notes (Signed)
[facility] POST-OP OFFICE VISIT  04/26/2022  HPI: Darrell Greer is a 21 y.o. male 23 days s/p open appendectomy for acute appendicitis with Dr Claudine Mouton  He continues to do well Pain is gone except for a few sudden positional changes No fever, chills, nausea, emesis, or bowel changes His incisions is nearly closed aside from a small scab inferior to the umbilicus He is tolerating PO No other complaints   Vital signs: BP 129/83   Pulse 98   Temp 98.5 F (36.9 C)   Ht 5\' 10"  (1.778 m)   Wt 238 lb (108 kg)   SpO2 98%   BMI 34.15 kg/m    Physical Exam: Constitutional: Well appearing male, NAD Abdomen: Soft, non-tender, non-distended, no rebound/guarding Skin: Laparotomy is healing well; previous opening inferior to the umbilicus is now closed, small scab present, no erythema or drainage   Assessment/Plan: This is a 21 y.o. male 23 days s/p open appendectomy for acute appendicitis with Dr 26   - Pain control prn  - Reviewed wound care recommendation  - Reviewed lifting restrictions; 6 weeks total  - He can follow up on as needed basis; He understands to call with questions/concerns  -- Claudine Mouton, PA-C [facility] 04/26/2022, 2:24 PM M-F: 7am - 4pm

## 2022-04-26 NOTE — Patient Instructions (Signed)

## 2022-04-30 ENCOUNTER — Telehealth: Payer: Self-pay | Admitting: *Deleted

## 2022-04-30 NOTE — Telephone Encounter (Signed)
Faxed STD to [facility] at [phone]

## 2022-09-18 IMAGING — CT CT ABD-PELV W/ CM
2 of 5 series · 13 of 46 positions shown, 15 images · IV contrast (APPLIED)
Comparison: 02/02/2022

CLINICAL DATA: Pain right lower quadrant

EXAM:
CT ABDOMEN AND PELVIS WITH CONTRAST
TECHNIQUE: Multidetector CT imaging of the abdomen and pelvis was performed
using the standard protocol following bolus administration of
intravenous contrast.

[Series 2: abdomen 5.0 · axial · 0.92mm/px · z∈[-1020,-530]mm · 10 of 120 slices shown, 12 images]
[im 11/120  soft-tissue]
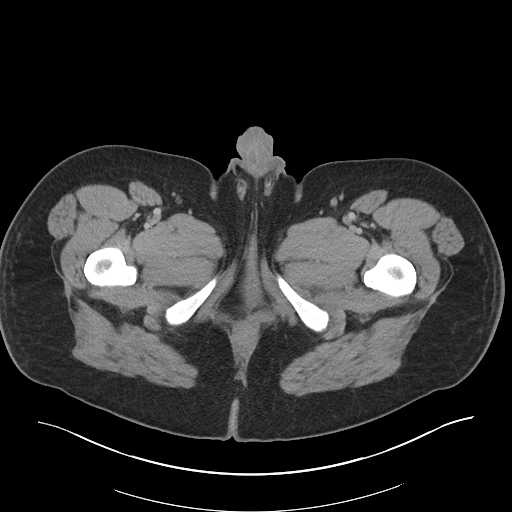
[im 11/120  bone]
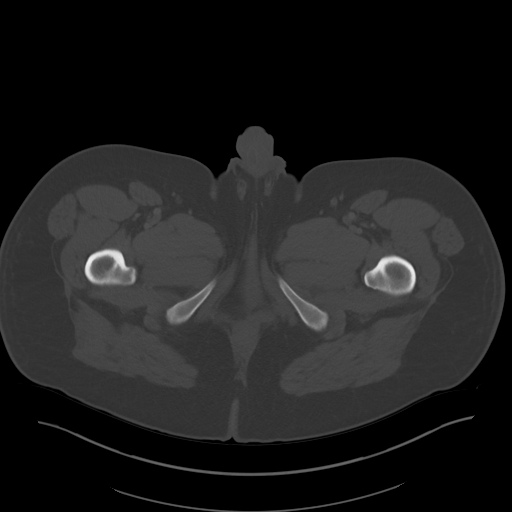
[im 22/120  soft-tissue]
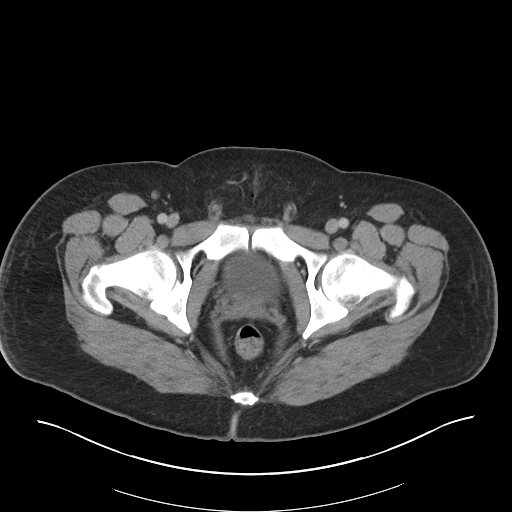
[im 33/120  soft-tissue]
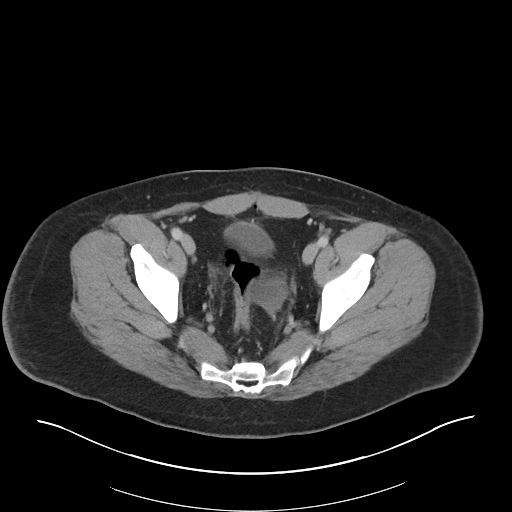
[im 44/120  soft-tissue]
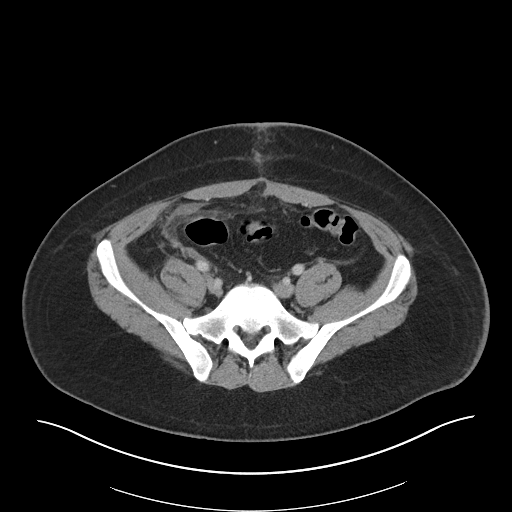
[im 55/120  soft-tissue]
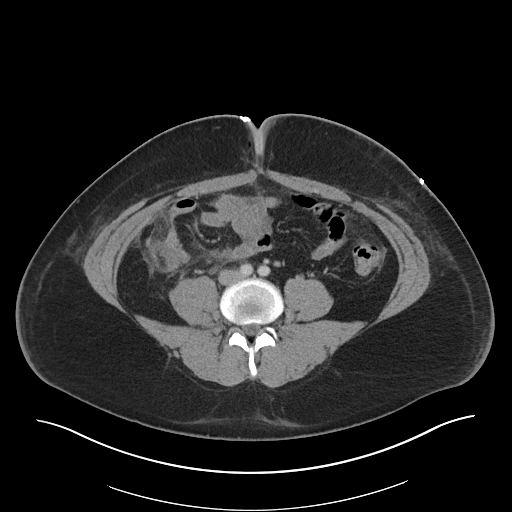
[im 65/120  soft-tissue]
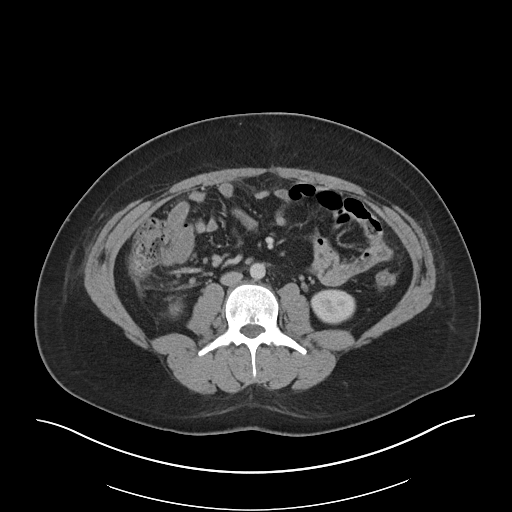
[im 76/120  soft-tissue]
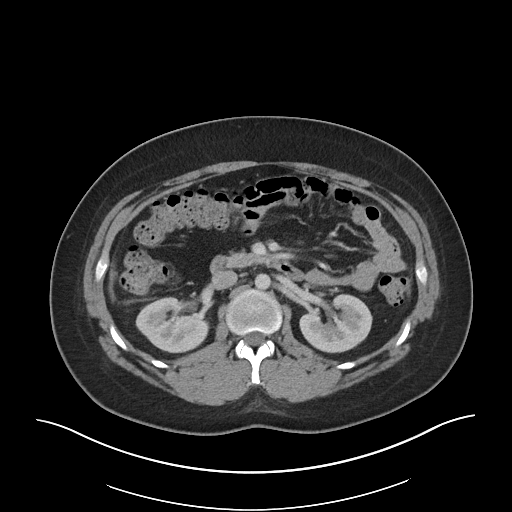
[im 87/120  soft-tissue]
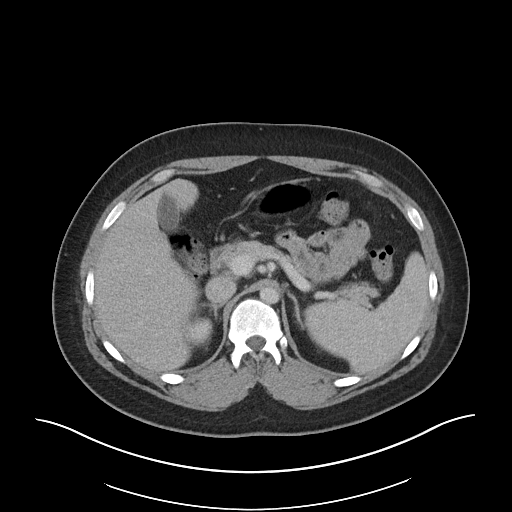
[im 98/120  soft-tissue]
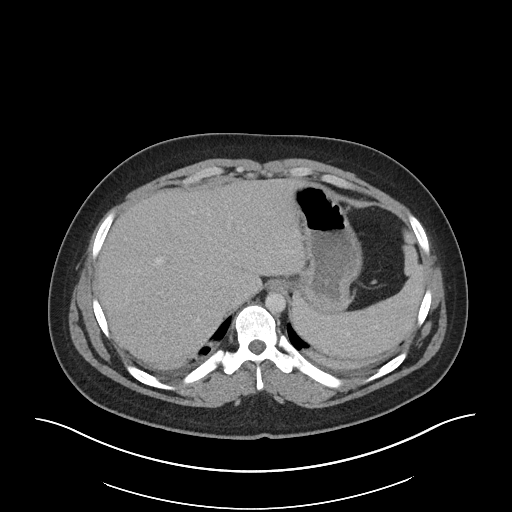
[im 98/120  bone]
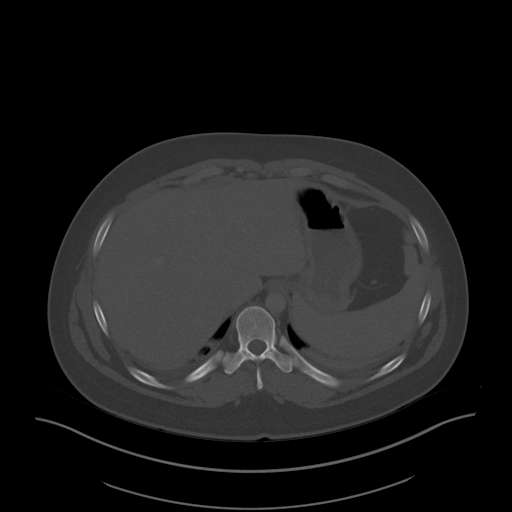
[im 109/120  soft-tissue]
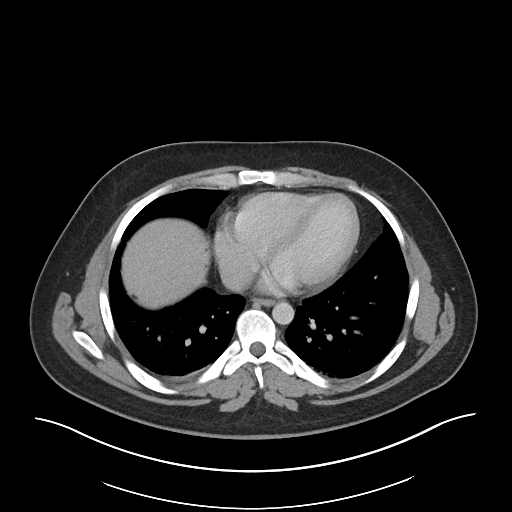

[Series 5: abdomen 3.0 mpr cor · coronal · 0.77mm/px · 3 of 102 slices shown]
[im 34/102  soft-tissue]
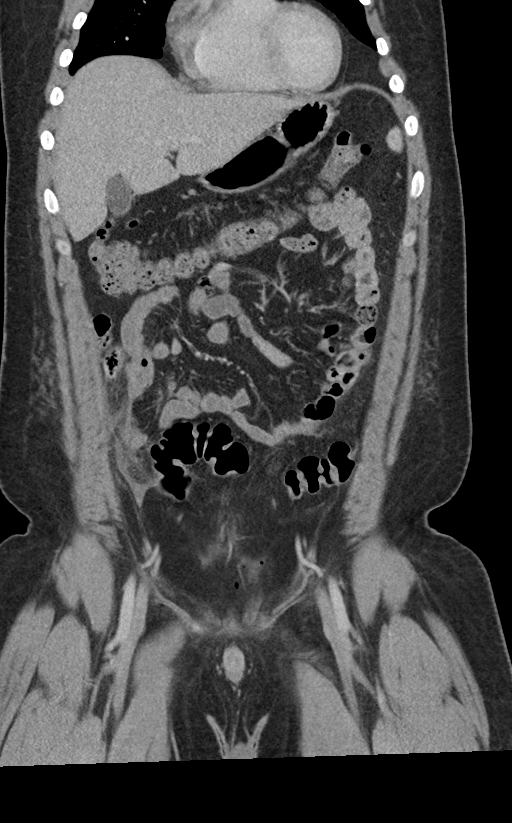
[im 45/102  soft-tissue]
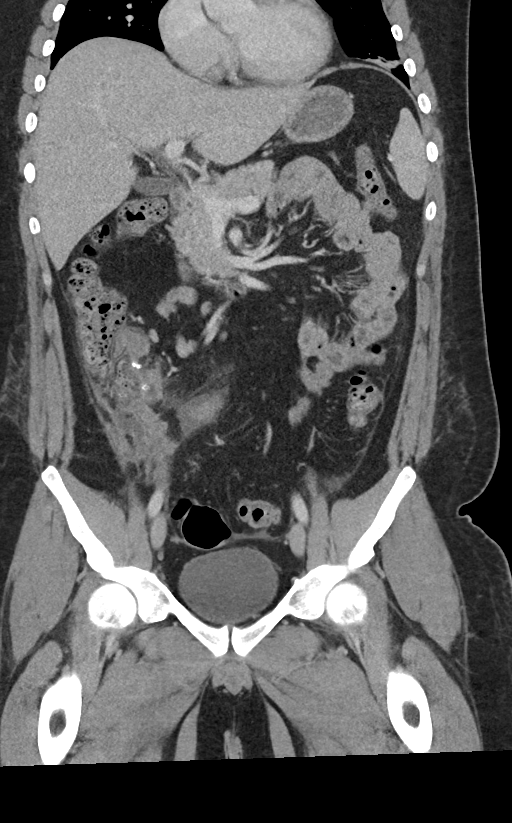
[im 57/102  soft-tissue]
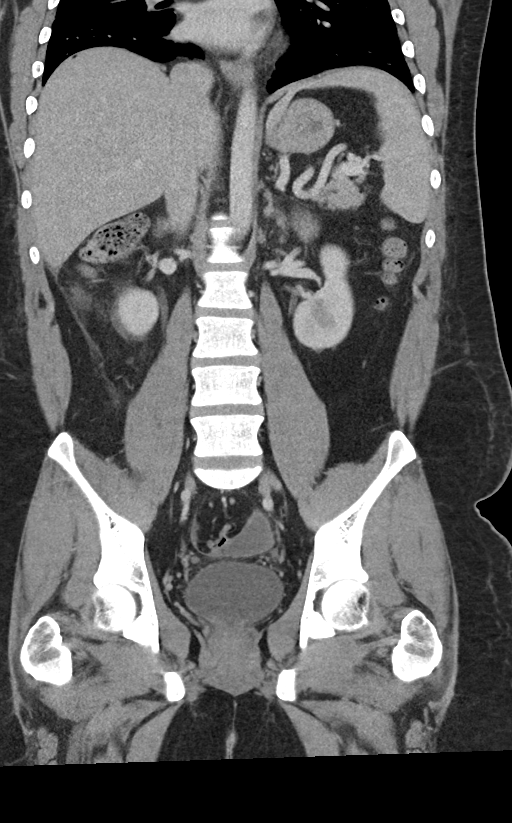

[13 of 46 positions shown; findings below may reference images not displayed]

RADIATION DOSE REDUCTION: This exam was performed according to the
departmental dose-optimization program which includes automated
exposure control, adjustment of the mA and/or kV according to
patient size and/or use of iterative reconstruction technique.

CONTRAST:  100mL OMNIPAQUE IOHEXOL 300 MG/ML  SOLN
FINDINGS: Lower chest: There are small linear patchy infiltrates in the lower
lung fields suggesting subsegmental atelectasis/pneumonia. Minimal
bilateral pleural effusions are seen. Minimal pericardial effusion
is seen.

Hepatobiliary: No focal abnormality is seen in the liver. There is
minimal amount of pneumoperitoneum adjacent to the liver. There is
no dilation of bile ducts. Gallbladder is unremarkable.

Pancreas: No focal abnormality is seen.

Spleen: Spleen is enlarged measuring 15 cm in maximum diameter.

Adrenals/Urinary Tract: Adrenals are unremarkable. There is no
hydronephrosis. There are no renal or ureteral stones. Urinary
bladder is unremarkable.

Stomach/Bowel: Stomach is unremarkable. Small bowel loops are not
dilated. Postsurgical changes are noted in the right lower quadrant
suggesting interval appendectomy. There is pericecal stranding. In
image 68 of series 2 there is 2.3 cm fluid collection in the
pericecal region. There is minimal amount of fluid along the
posterior margin of cecum. There is no significant wall thickening
in colon.

Vascular/Lymphatic: Vascular structures are unremarkable.

Reproductive: Unremarkable.

Other: Small amount of free fluid is seen in the left pelvic cavity.
There is minimal pneumoperitoneum adjacent to the liver. There is
stranding in the subcutaneous plane in the suprapubic region. Skin
staples are seen in the suprapubic region. These findings appear to
be related to recent surgery.

Musculoskeletal: Unremarkable.
IMPRESSION: Minimal amount of ascites and minimal amount of pneumoperitoneum may
be related to recent appendectomy. There is residual stranding and
small foci of serous fluid collections in the pericecal region, most
likely related to recent appendicitis and appendectomy. There is no
demonstrable loculated thick-walled fluid collection to suggest an
abscess.

There is no evidence intestinal obstruction or pneumoperitoneum.
There is no hydronephrosis.

Small patchy infiltrates are seen in both lower lung fields
suggesting subsegmental atelectasis/pneumonia. Minimal bilateral
pleural effusions.

Enlarged spleen.

Other findings as described in the body of the report.

## 2024-10-22 ENCOUNTER — Ambulatory Visit
Admission: EM | Admit: 2024-10-22 | Discharge: 2024-10-22 | Disposition: A | Discharge status: Home / Self Care | Attending: Physician Assistant | Admitting: Physician Assistant

## 2024-10-22 DIAGNOSIS — J069 Acute upper respiratory infection, unspecified: Secondary | ICD-10-CM | POA: Insufficient documentation

## 2024-10-22 DIAGNOSIS — J029 Acute pharyngitis, unspecified: Secondary | ICD-10-CM | POA: Diagnosis not present

## 2024-10-22 DIAGNOSIS — R490 Dysphonia: Secondary | ICD-10-CM | POA: Insufficient documentation

## 2024-10-22 DIAGNOSIS — R0981 Nasal congestion: Secondary | ICD-10-CM | POA: Diagnosis present

## 2024-10-22 LAB — POC COVID19/FLU A&B COMBO
Covid Antigen, POC: NEGATIVE
Influenza A Antigen, POC: NEGATIVE
Influenza B Antigen, POC: NEGATIVE

## 2024-10-22 LAB — POCT RAPID STREP A (OFFICE): Rapid Strep A Screen: NEGATIVE

## 2024-10-22 MED ORDER — PREDNISONE 20 MG PO TABS: 40.0000 mg | ORAL_TABLET | Freq: Every day | ORAL | 0 refills | Status: AC

## 2024-10-22 MED ORDER — IPRATROPIUM BROMIDE 0.06 % NA SOLN: 2.0000 | Freq: Four times a day (QID) | NASAL | 0 refills | Status: AC

## 2024-10-22 MED ORDER — PSEUDOEPH-BROMPHEN-DM 30-2-10 MG/5ML PO SYRP: 10.0000 mL | ORAL_SOLUTION | Freq: Four times a day (QID) | ORAL | 0 refills | Status: AC | PRN

## 2024-10-22 NOTE — ED Provider Notes (Signed)
 [facility]    CSN: [id number] Arrival date & time: 10/22/24  1710      History   Chief Complaint Chief Complaint  Patient presents with   Sore Throat    HPI Darrell Greer is a 23 y.o. male presenting for fatigue, cough, congestion, sore throat and bilateral ear fullness since yesterday.  Denies fever, ear pain, sinus pain, chest pain, wheezing, shortness of breath, abdominal pain, vomiting or diarrhea.   Patient has been taking over-the-counter meds without relief. He requests prednisone for voice hoarseness. 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 No other complaints.   HPI  Past Medical History:  Diagnosis Date   Allergy     Patient Active Problem List   Diagnosis Date Noted   Chronic appendicitis 04/03/2022   Acute appendicitis 04/02/2022   Closed nondisplaced fracture of distal phalanx of left ring finger 10/30/2021   Left hand pain 10/30/2021    Past Surgical History:  Procedure Laterality Date   LAPAROSCOPIC APPENDECTOMY N/A 04/03/2022   Procedure: APPENDECTOMY LAPAROSCOPIC Converted to Open Appendectomy;  Surgeon: Lane Shope, MD;  Location: [facility] ORS;  Service: General;  Laterality: N/A;   NOSE SURGERY  2015   wisdome tooth         Home Medications    Prior to Admission medications   Medication Sig Start Date End Date Taking? Authorizing Provider  brompheniramine-pseudoephedrine-DM 30-2-10 MG/5ML syrup Take 10 mLs by mouth 4 (four) times daily as needed for up to 7 days. 10/22/24 10/29/24 Yes Arvis Huxley B, PA-C  ipratropium (ATROVENT) 0.06 % nasal spray Place 2 sprays into both nostrils 4 (four) times daily. 10/22/24  Yes Arvis Huxley NOVAK, PA-C  predniSONE (DELTASONE) 20 MG tablet Take 2 tablets (40 mg total) by mouth daily for 5 days. 10/22/24 10/27/24 Yes Arvis Huxley B, PA-C  COVID-19 adjuvanted vaccine, NOVAVAX, (NOVAVAX COVID-19 VACCINE ) 5 MCG/0.5ML injection Inject into the muscle. 04/19/22   Luiz Channel, MD  ibuprofen   (ADVIL ) 600 MG tablet Take 1 tablet (600 mg total) by mouth every 6 (six) hours as needed. 04/05/22   Schulz, Zachary R, PA-C  loratadine (CLARITIN) 10 MG tablet Take 10 mg by mouth daily.    [provider]  pseudoephedrine (SUDAFED) 120 MG 12 hr tablet Take 120 mg by mouth 2 (two) times daily as needed for congestion.    [provider]    Family History Family History  Problem Relation Age of Onset   Alcohol abuse Brother    Bipolar disorder Brother    Throat cancer Maternal Grandfather    Breast cancer Paternal Grandmother    Healthy Sister    Bladder Cancer Paternal Grandfather    Heart disease Paternal Grandfather        Pacemaker   Depression Brother     Social History Social History   Tobacco Use   Smoking status: Never    Passive exposure: Past   Smokeless tobacco: Never  Vaping Use   Vaping status: Never Used  Substance Use Topics   Alcohol use: Never   Drug use: Never     Allergies   Cefdinir   Review of Systems Review of Systems  Constitutional:  Negative for fatigue and fever.  HENT:  Positive for congestion, rhinorrhea, sore throat and voice change. Negative for ear pain, sinus pressure and sinus pain.   Respiratory:  Negative for cough and shortness of breath.   Gastrointestinal:  Negative for abdominal pain, diarrhea, nausea and vomiting.  Musculoskeletal:  Negative for myalgias.  Neurological:  Negative for weakness, light-headedness and headaches.  Hematological:  Negative for adenopathy.     Physical Exam Triage Vital Signs ED Triage Vitals  Encounter Vitals Group     BP 10/22/24 1747 117/78     Girls Systolic BP Percentile --      Girls Diastolic BP Percentile --      Boys Systolic BP Percentile --      Boys Diastolic BP Percentile --      Pulse Rate 10/22/24 1747 72     Resp 10/22/24 1747 18     Temp 10/22/24 1747 97.8 F (36.6 C)     Temp Source 10/22/24 1747 Oral     SpO2 10/22/24 1747 100 %     Weight 10/22/24  1746 237 lb (107.5 kg)     Height --      Head Circumference --      Peak Flow --      Pain Score 10/22/24 1746 0     Pain Loc --      Pain Education --      Exclude from Growth Chart --    No data found.  Updated Vital Signs BP 117/78 (BP Location: Left Arm)   Pulse 72   Temp 97.8 F (36.6 C) (Oral)   Resp 18   Wt 237 lb (107.5 kg)   SpO2 100%   BMI 34.01 kg/m       Physical Exam Vitals and nursing note reviewed.  Constitutional:      General: He is not in acute distress.    Appearance: Normal appearance. He is well-developed. He is not ill-appearing.  HENT:     Head: Normocephalic and atraumatic.     Right Ear: Tympanic membrane, ear canal and external ear normal.     Left Ear: Tympanic membrane, ear canal and external ear normal.     Nose: Congestion present.     Mouth/Throat:     Mouth: Mucous membranes are moist.     Pharynx: Oropharynx is clear. Posterior oropharyngeal erythema present.     Tonsils: 1+ on the right. 1+ on the left.  Eyes:     General: No scleral icterus.    Conjunctiva/sclera: Conjunctivae normal.  Cardiovascular:     Rate and Rhythm: Normal rate and regular rhythm.  Pulmonary:     Effort: Pulmonary effort is normal. No respiratory distress.     Breath sounds: Normal breath sounds.  Musculoskeletal:     Cervical back: Neck supple.  Skin:    General: Skin is warm and dry.     Capillary Refill: Capillary refill takes less than 2 seconds.  Neurological:     General: No focal deficit present.     Mental Status: He is alert. Mental status is at baseline.     Motor: No weakness.     Gait: Gait normal.  Psychiatric:        Mood and Affect: Mood normal.        Behavior: Behavior normal.      UC Treatments / Results  Labs (all labs ordered are listed, but only abnormal results are displayed) Labs Reviewed  POC COVID19/FLU A&B COMBO - Normal  POCT RAPID STREP A (OFFICE) - Normal  CULTURE, GROUP A STREP [facility])     EKG   Radiology No results found.  Procedures Procedures (including critical care time)  Medications Ordered in UC Medications - No data to display  Initial Impression / Assessment and Plan /  UC Course  I have reviewed the triage vital signs and the nursing notes.  Pertinent labs & imaging results that were available during my care of the patient were reviewed by me and considered in my medical decision making (see chart for details).   23 y/o male presents for onset of nasal congestion, bilateral ear fullness and sore throat yesterday. No fever.   Vitals stable and normal. Patient is overall well appearing. No evidence of ear infection on exam. Has nasal congestion and mild posterior pharyngeal erythema. Chest is clear. Heart RRR.   Rapid flu/COVID and strep testing obtained.  All negative.  Strep culture obtained.  Will treat if positive.  Upper respiratory infection.  Sent Atrovent nasal spray, Bromfed-DM and prednisone.  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  Advised him I am not sure if it will help.  He states he has taken in the past and it has been helpful.  Sent to pharmacy.  Encouraged increasing rest and fluids.  Reviewed return precautions.   Final Clinical Impressions(s) / UC Diagnoses   Final diagnoses:  Sore throat  Viral upper respiratory tract infection  Nasal congestion  Voice hoarseness     Discharge Instructions      URI/COLD SYMPTOMS: Your exam today is consistent with a viral illness. Antibiotics are not indicated at this time. Use medications as directed, including cough syrup, nasal saline, and decongestants. Your symptoms should improve over the next few days and resolve within 7-10 days. Increase rest and fluids. F/u if symptoms worsen or predominate such as sore throat, ear pain, productive cough, shortness of breath, or if you develop high fevers or worsening fatigue over the next several  days.       ED Prescriptions     Medication Sig Dispense Auth. Provider   ipratropium (ATROVENT) 0.06 % nasal spray Place 2 sprays into both nostrils 4 (four) times daily. 15 mL Arvis Huxley B, PA-C   brompheniramine-pseudoephedrine-DM 30-2-10 MG/5ML syrup Take 10 mLs by mouth 4 (four) times daily as needed for up to 7 days. 150 mL Arvis Huxley B, PA-C   predniSONE (DELTASONE) 20 MG tablet Take 2 tablets (40 mg total) by mouth daily for 5 days. 10 tablet Karder Goodin B, PA-C      PDMP not reviewed this encounter.   Arvis Huxley NOVAK, PA-C 10/22/24 [phone]

## 2024-10-22 NOTE — ED Triage Notes (Signed)
 Pt present sore throat with nasal congestion and ear fullness. Symptoms started yesterday.

## 2024-10-22 NOTE — Discharge Instructions (Addendum)

## 2024-10-25 LAB — CULTURE, GROUP A STREP (THRC)

## 2024-10-26 ENCOUNTER — Ambulatory Visit (HOSPITAL_COMMUNITY): Payer: Self-pay
